# Patient Record
Sex: Female | Born: 1935 | Race: White | Hispanic: No | Marital: Single | Smoking: Never smoker
Health system: Southern US, Community
[De-identification: ages and names within clinical notes are randomized; demographics above are authoritative.]

## PROBLEM LIST (undated history)

## (undated) DIAGNOSIS — I10 Essential (primary) hypertension: Secondary | ICD-10-CM

## (undated) DIAGNOSIS — R0602 Shortness of breath: Secondary | ICD-10-CM

---

## 1974-05-22 HISTORY — PX: ABDOMINAL SURGERY: SHX537

## 2001-07-02 ENCOUNTER — Emergency Department (HOSPITAL_COMMUNITY): Admission: EM | Admit: 2001-07-02 | Discharge: 2001-07-02 | Payer: Self-pay | Admitting: Emergency Medicine

## 2004-05-20 ENCOUNTER — Emergency Department: Payer: Self-pay | Admitting: Emergency Medicine

## 2008-09-03 ENCOUNTER — Emergency Department: Payer: Self-pay | Admitting: Emergency Medicine

## 2010-03-30 IMAGING — CT CT HEAD WITHOUT CONTRAST
2 series · 15 of 30 positions shown, 19 images · non-contrast
Comparison: none

REASON FOR EXAM: fall/ pain
COMMENTS:   LMP: Post-Menopausal

PROCEDURE:     CT  - CT HEAD WITHOUT CONTRAST  - September 03, 2008  [DATE]
RESULT:     Comparison: No comparison
TECHNIQUE: Multiple axial images from the foramen magnum to the vertex were
obtained without IV contrast.

[Series 2: without · axial · non-contrast · 0.41mm/px · z∈[+444,+579]mm · 13 of 33 slices shown, 17 images]
[im 3/33  brain]
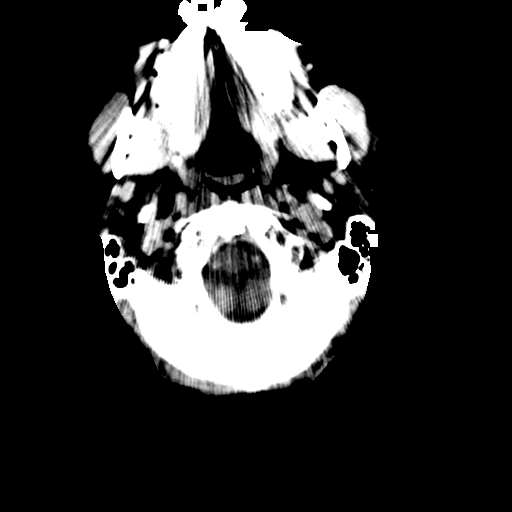
[im 3/33  bone]
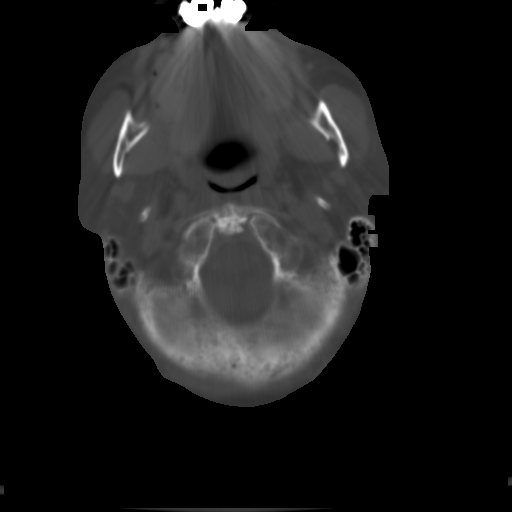
[im 5/33  brain]
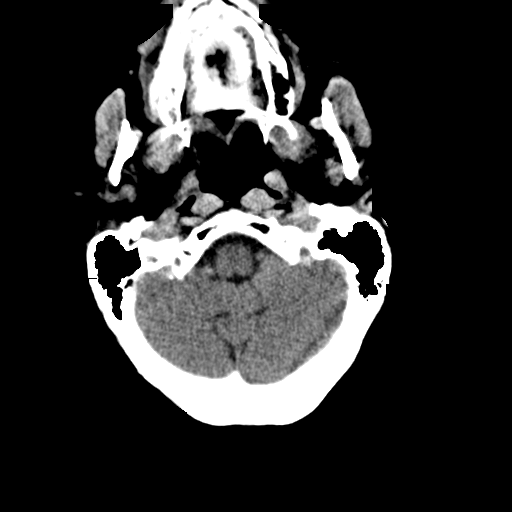
[im 7/33  brain]
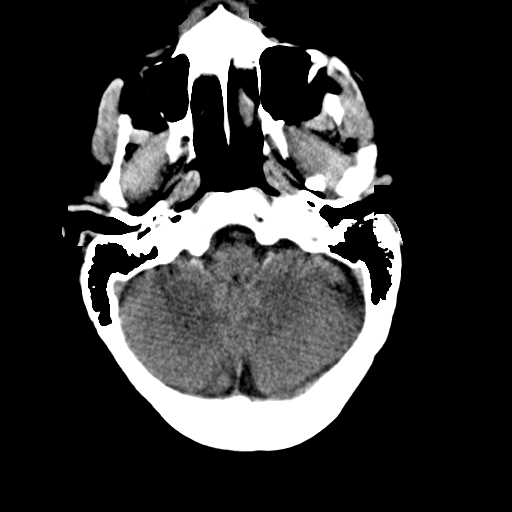
[im 10/33  brain]
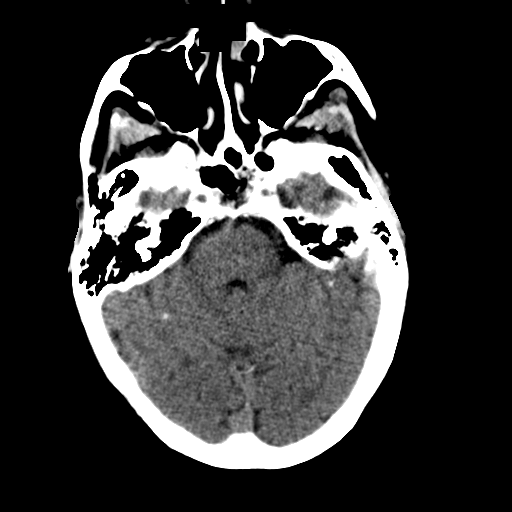
[im 12/33  brain]
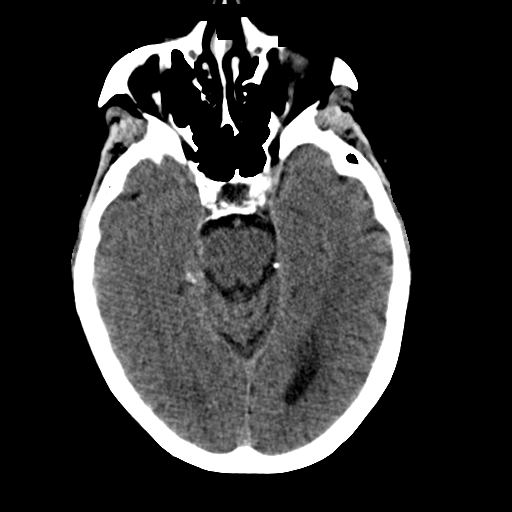
[im 12/33  bone]
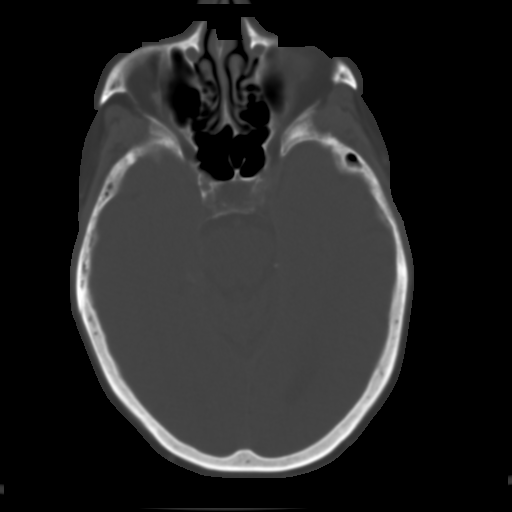
[im 14/33  brain]
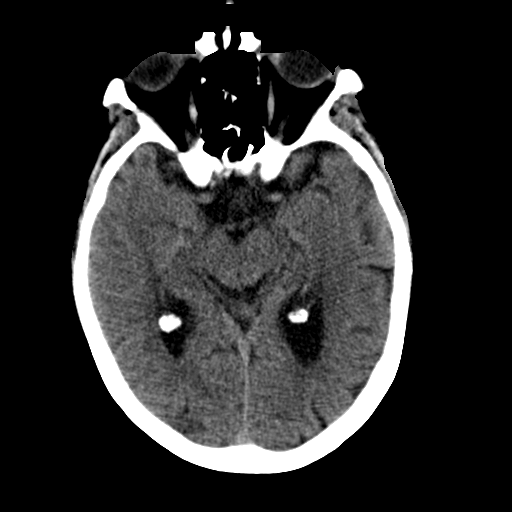
[im 17/33  brain]
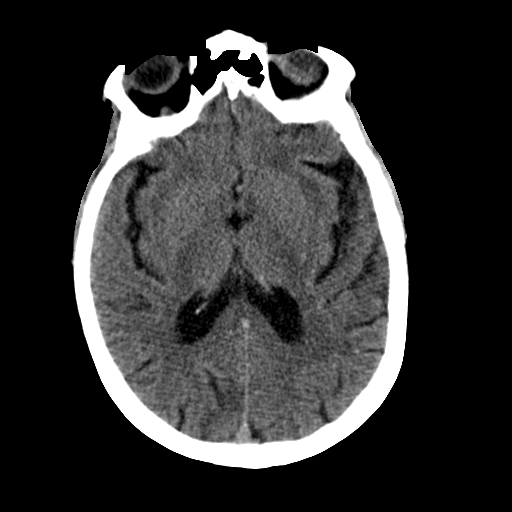
[im 19/33  brain]
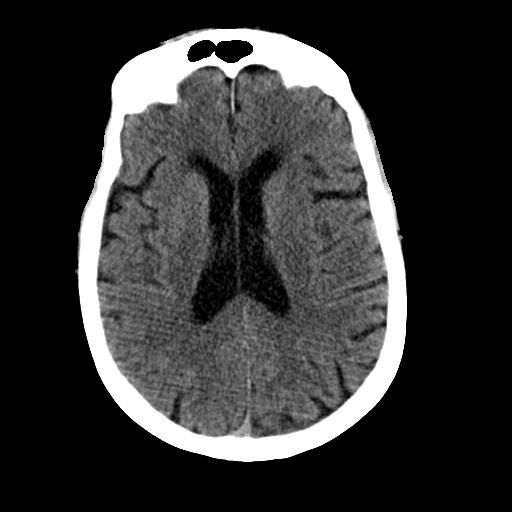
[im 21/33  brain]
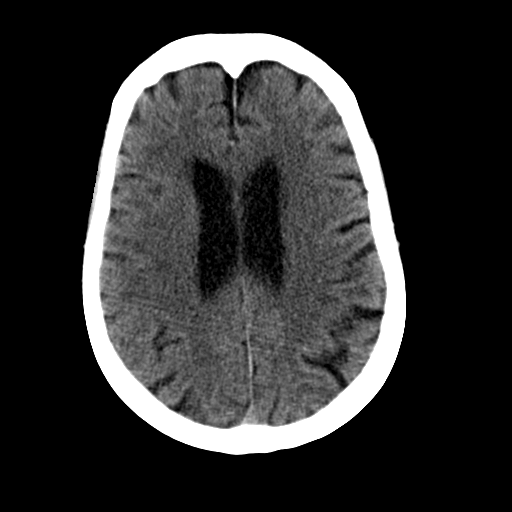
[im 21/33  bone]
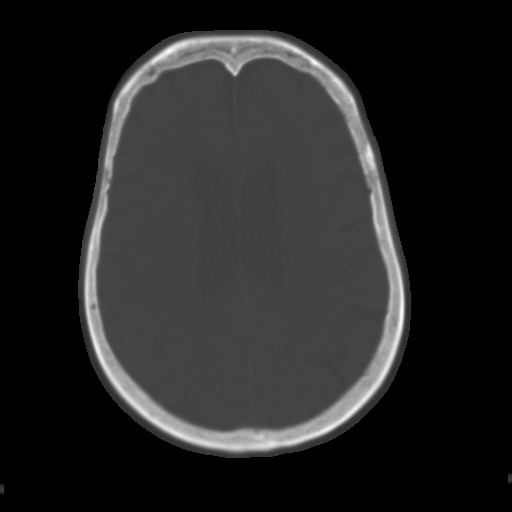
[im 23/33  brain]
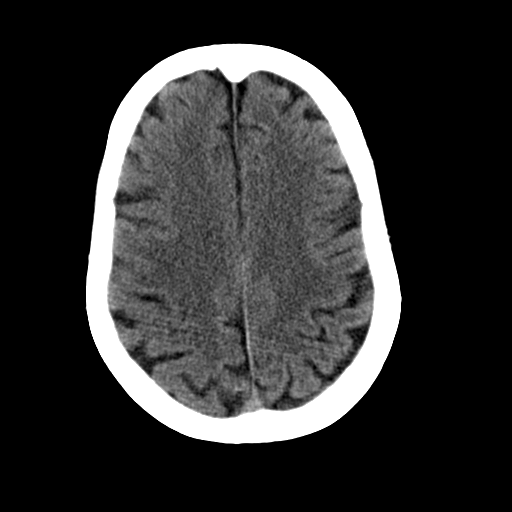
[im 26/33  brain]
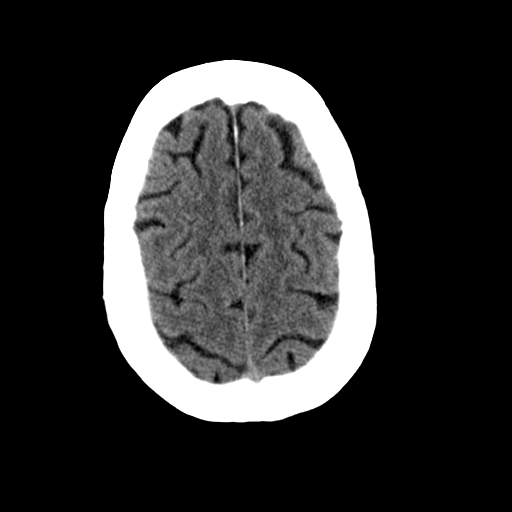
[im 28/33  brain]
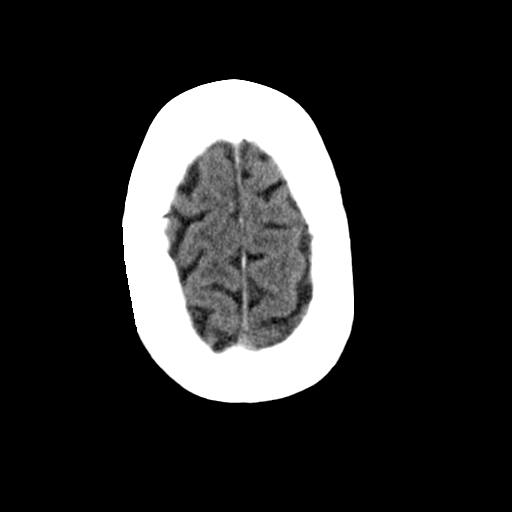
[im 30/33  brain]
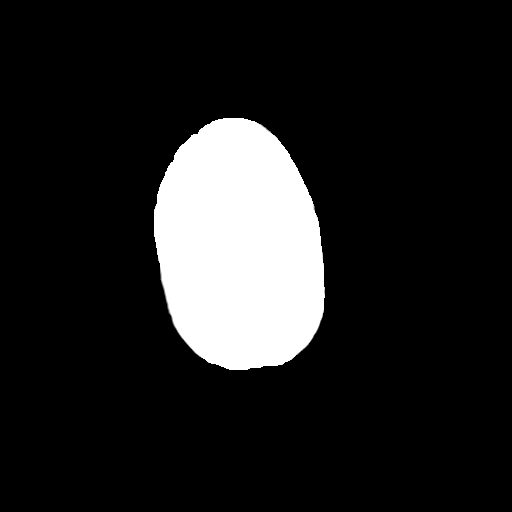
[im 30/33  bone]
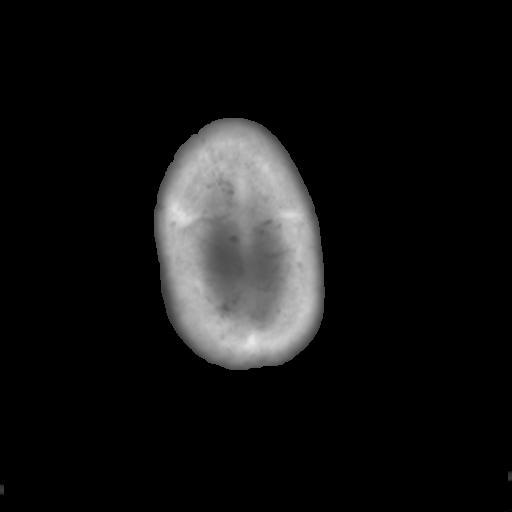

[Series 3: bone · axial · 0.41mm/px · z∈[+444,+464]mm · 2 of 33 slices shown]
[im 3/33  bone]
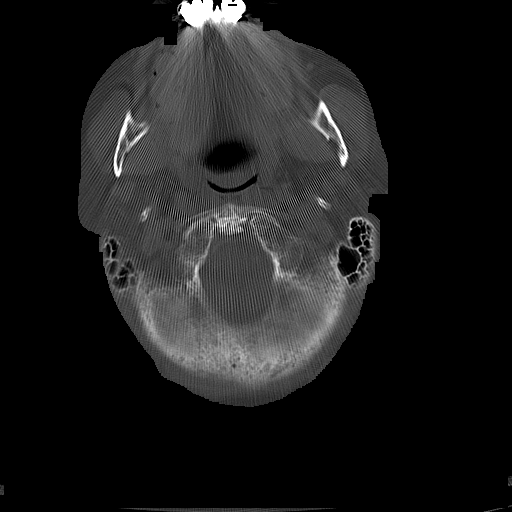
[im 7/33  bone]
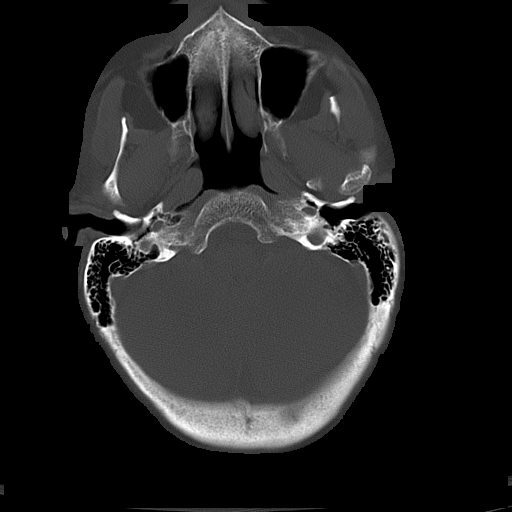

[15 of 30 positions shown; findings below may reference images not displayed]

FINDINGS: There is no evidence for mass effect, midline shift, or extra-axial fluid
collections.  There is no evidence for space-occupying lesion or
intracranial hemorrhage. There is no evidence for a cortical-based area of
acute infarction.

There is generalized cerebral atrophy. There is periventricular white matter
low attenuation likely secondary to microangiopathy.

The ventricles and sulci are appropriate for the patient's age. The basal
cisterns are patent.

Visualized portions of the orbits are unremarkable. The paranasal sinuses
and mastoid air cells are unremarkable.

The osseous structures are unremarkable.
IMPRESSION: No acute intracranial process.

## 2010-03-30 IMAGING — CR DG CHEST 1V
1 series · 1 of 1 positions shown · non-contrast
Comparison: none

REASON FOR EXAM: fall/pain
COMMENTS:   LMP: Post-Menopausal

[view not recorded]
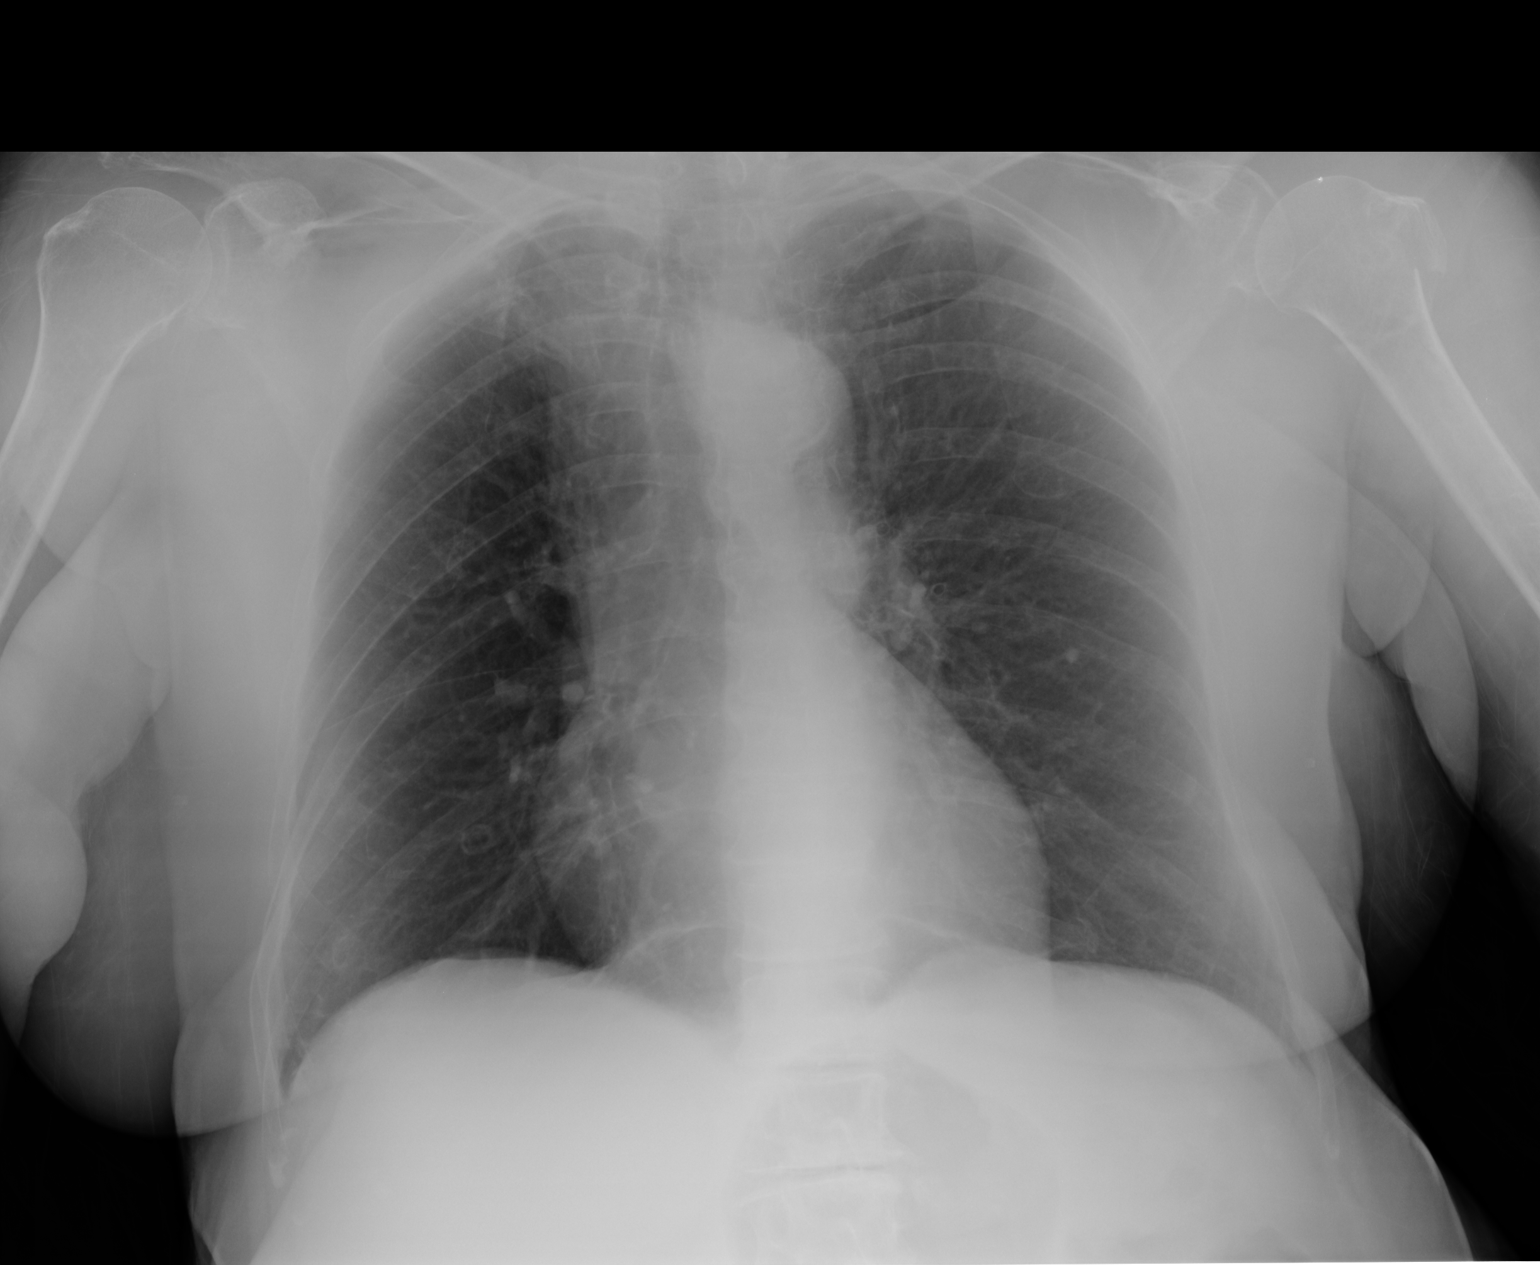

[1 of 1 positions shown; findings below may reference images not displayed]

PROCEDURE:     DXR - DXR CHEST 1 VIEWAP OR PA  - September 03, 2008  [DATE]

RESULT:     The lungs are adequately inflated. There is no focal infiltrate.
The cardiac silhouette is normal in size. The pulmonary vascularity is not
engorged. The patient has sustained a fracture of the humeral neck and
greater tuberosity on the left.
IMPRESSION: I do not see evidence of acute cardiopulmonary abnormality.
There is likely an element of underlying COPD.

## 2010-07-15 ENCOUNTER — Ambulatory Visit
Admission: RE | Admit: 2010-07-15 | Discharge: 2010-07-15 | Disposition: A | Discharge status: Home / Self Care | Payer: Medicare Other | Source: Ambulatory Visit | Attending: Endocrinology | Admitting: Endocrinology

## 2010-07-15 ENCOUNTER — Other Ambulatory Visit: Payer: Self-pay | Admitting: Endocrinology

## 2010-07-15 DIAGNOSIS — E049 Nontoxic goiter, unspecified: Secondary | ICD-10-CM

## 2011-04-28 ENCOUNTER — Other Ambulatory Visit: Payer: Self-pay | Admitting: Endocrinology

## 2011-04-28 DIAGNOSIS — E049 Nontoxic goiter, unspecified: Secondary | ICD-10-CM

## 2011-05-22 ENCOUNTER — Ambulatory Visit
Admission: RE | Admit: 2011-05-22 | Discharge: 2011-05-22 | Disposition: A | Discharge status: Home / Self Care | Payer: Medicare Other | Source: Ambulatory Visit | Attending: Endocrinology | Admitting: Endocrinology

## 2011-05-22 DIAGNOSIS — E049 Nontoxic goiter, unspecified: Secondary | ICD-10-CM

## 2013-06-16 ENCOUNTER — Emergency Department: Payer: Self-pay | Admitting: Emergency Medicine

## 2013-06-16 ENCOUNTER — Inpatient Hospital Stay (HOSPITAL_COMMUNITY)
Admission: AD | Admit: 2013-06-16 | Discharge: 2013-06-21 | DRG: 374 | Disposition: A | Discharge status: Home / Self Care | Payer: Medicare Other | Source: Other Acute Inpatient Hospital | Attending: Family Medicine | Admitting: Family Medicine

## 2013-06-16 ENCOUNTER — Encounter (HOSPITAL_COMMUNITY): Payer: Self-pay | Admitting: *Deleted

## 2013-06-16 DIAGNOSIS — F0391 Unspecified dementia with behavioral disturbance: Secondary | ICD-10-CM | POA: Diagnosis present

## 2013-06-16 DIAGNOSIS — R933 Abnormal findings on diagnostic imaging of other parts of digestive tract: Secondary | ICD-10-CM

## 2013-06-16 DIAGNOSIS — Z638 Other specified problems related to primary support group: Secondary | ICD-10-CM

## 2013-06-16 DIAGNOSIS — Z66 Do not resuscitate: Secondary | ICD-10-CM | POA: Diagnosis present

## 2013-06-16 DIAGNOSIS — D649 Anemia, unspecified: Secondary | ICD-10-CM | POA: Diagnosis present

## 2013-06-16 DIAGNOSIS — I214 Non-ST elevation (NSTEMI) myocardial infarction: Secondary | ICD-10-CM | POA: Diagnosis present

## 2013-06-16 DIAGNOSIS — E039 Hypothyroidism, unspecified: Secondary | ICD-10-CM | POA: Diagnosis present

## 2013-06-16 DIAGNOSIS — I10 Essential (primary) hypertension: Secondary | ICD-10-CM | POA: Diagnosis present

## 2013-06-16 DIAGNOSIS — C787 Secondary malignant neoplasm of liver and intrahepatic bile duct: Secondary | ICD-10-CM | POA: Diagnosis present

## 2013-06-16 DIAGNOSIS — R7989 Other specified abnormal findings of blood chemistry: Secondary | ICD-10-CM | POA: Diagnosis present

## 2013-06-16 DIAGNOSIS — F03918 Unspecified dementia, unspecified severity, with other behavioral disturbance: Secondary | ICD-10-CM | POA: Diagnosis present

## 2013-06-16 DIAGNOSIS — R634 Abnormal weight loss: Secondary | ICD-10-CM | POA: Diagnosis present

## 2013-06-16 DIAGNOSIS — R1900 Intra-abdominal and pelvic swelling, mass and lump, unspecified site: Secondary | ICD-10-CM

## 2013-06-16 DIAGNOSIS — C182 Malignant neoplasm of ascending colon: Principal | ICD-10-CM | POA: Diagnosis present

## 2013-06-16 DIAGNOSIS — C50919 Malignant neoplasm of unspecified site of unspecified female breast: Secondary | ICD-10-CM | POA: Diagnosis present

## 2013-06-16 DIAGNOSIS — C189 Malignant neoplasm of colon, unspecified: Secondary | ICD-10-CM

## 2013-06-16 DIAGNOSIS — R778 Other specified abnormalities of plasma proteins: Secondary | ICD-10-CM | POA: Diagnosis present

## 2013-06-16 DIAGNOSIS — K922 Gastrointestinal hemorrhage, unspecified: Secondary | ICD-10-CM | POA: Diagnosis present

## 2013-06-16 DIAGNOSIS — D509 Iron deficiency anemia, unspecified: Secondary | ICD-10-CM | POA: Diagnosis present

## 2013-06-16 DIAGNOSIS — Z9119 Patient's noncompliance with other medical treatment and regimen: Secondary | ICD-10-CM

## 2013-06-16 DIAGNOSIS — Z515 Encounter for palliative care: Secondary | ICD-10-CM

## 2013-06-16 DIAGNOSIS — R4182 Altered mental status, unspecified: Secondary | ICD-10-CM | POA: Diagnosis present

## 2013-06-16 DIAGNOSIS — Z91199 Patient's noncompliance with other medical treatment and regimen due to unspecified reason: Secondary | ICD-10-CM

## 2013-06-16 DIAGNOSIS — R531 Weakness: Secondary | ICD-10-CM

## 2013-06-16 DIAGNOSIS — C779 Secondary and unspecified malignant neoplasm of lymph node, unspecified: Secondary | ICD-10-CM

## 2013-06-16 DIAGNOSIS — I6529 Occlusion and stenosis of unspecified carotid artery: Secondary | ICD-10-CM | POA: Diagnosis present

## 2013-06-16 DIAGNOSIS — K921 Melena: Secondary | ICD-10-CM

## 2013-06-16 DIAGNOSIS — R0602 Shortness of breath: Secondary | ICD-10-CM | POA: Diagnosis present

## 2013-06-16 HISTORY — DX: Essential (primary) hypertension: I10

## 2013-06-16 HISTORY — DX: Shortness of breath: R06.02

## 2013-06-16 LAB — COMPREHENSIVE METABOLIC PANEL
ALT: 15 U/L (ref 12–78)
Albumin: 2.6 g/dL — ABNORMAL LOW (ref 3.4–5.0)
Alkaline Phosphatase: 111 U/L
Anion Gap: 7 (ref 7–16)
BUN: 14 mg/dL (ref 7–18)
Bilirubin,Total: 0.4 mg/dL (ref 0.2–1.0)
CO2: 25 mmol/L (ref 21–32)
Calcium, Total: 8.3 mg/dL — ABNORMAL LOW (ref 8.5–10.1)
Chloride: 106 mmol/L (ref 98–107)
Creatinine: 0.93 mg/dL (ref 0.60–1.30)
EGFR (Non-African Amer.): 55 — ABNORMAL LOW
GLUCOSE: 91 mg/dL (ref 65–99)
OSMOLALITY: 276 (ref 275–301)
Potassium: 4.2 mmol/L (ref 3.5–5.1)
SGOT(AST): 59 U/L — ABNORMAL HIGH (ref 15–37)
SODIUM: 138 mmol/L (ref 136–145)
Total Protein: 5.7 g/dL — ABNORMAL LOW (ref 6.4–8.2)

## 2013-06-16 LAB — CBC
HCT: 25.2 % — ABNORMAL LOW (ref 35.0–47.0)
HGB: 7.5 g/dL — AB (ref 12.0–16.0)
MCH: 20.8 pg — AB (ref 26.0–34.0)
MCHC: 29.6 g/dL — ABNORMAL LOW (ref 32.0–36.0)
MCV: 70 fL — ABNORMAL LOW (ref 80–100)
Platelet: 372 10*3/uL (ref 150–440)
RBC: 3.6 10*6/uL — AB (ref 3.80–5.20)
RDW: 23.9 % — AB (ref 11.5–14.5)
WBC: 4.5 10*3/uL (ref 3.6–11.0)

## 2013-06-16 LAB — URINALYSIS, COMPLETE
Bacteria: NEGATIVE
Bilirubin,UR: NEGATIVE
Blood: NEGATIVE
GLUCOSE, UR: NEGATIVE mg/dL (ref 0–75)
Leukocyte Esterase: NEGATIVE
NITRITE: NEGATIVE
PH: 6 (ref 4.5–8.0)
Protein: 30
RBC, UR: NONE SEEN /HPF (ref 0–5)
Specific Gravity: 1.015 (ref 1.003–1.030)
WBC UR: NONE SEEN /HPF (ref 0–5)

## 2013-06-16 LAB — CK TOTAL AND CKMB (NOT AT ARMC)
CK, TOTAL: 209 U/L (ref 21–215)
CK-MB: 12.5 ng/mL — ABNORMAL HIGH (ref 0.5–3.6)

## 2013-06-16 LAB — CK-MB
CK-MB: 19.7 ng/mL — ABNORMAL HIGH (ref 0.5–3.6)
CK-MB: 28.3 ng/mL — ABNORMAL HIGH (ref 0.5–3.6)

## 2013-06-16 LAB — T4, FREE: FREE THYROXINE: 0.32 ng/dL — AB (ref 0.76–1.46)

## 2013-06-16 LAB — TSH: THYROID STIMULATING HORM: 47.3 u[IU]/mL — AB

## 2013-06-16 LAB — PROTIME-INR
INR: 1
Prothrombin Time: 12.9 secs (ref 11.5–14.7)

## 2013-06-16 LAB — TROPONIN I: Troponin-I: 3.31 ng/mL — ABNORMAL HIGH

## 2013-06-16 NOTE — Progress Notes (Signed)
RN paged [employer]er, [employer]. Awaiting for admission orders.

## 2013-06-16 NOTE — H&P (Signed)
PCP:  [facility]   Chief Complaint:  Chest pain  HPI: Wendy Wade is a 80 y.o. female   has a past medical history of Shortness of breath and Hypertension.   Presented with  Patient has been confused and has underlying dementia and unable to provide much of her own history. 2 sons at bedside. Per Family patient has been deteriorating over the past few weeks/months. Today they brought her in to emergency department at [facility] GU 2 complains of throat pain radiating to the jaws bilaterally it as well as some early chest pains. Patient have had dyspnea on exertion for quite some time. She hasn't followed up with primary care provider have been seeing Dr. Lorrene Reid she on numerous occasions. Patient has history of anemia with baseline hemoglobin around 7.8 few months ago. Today at [facility] hemoglobin was noted to be 7.5 with elevated troponin of 3 and a TSH of 47. The patient refused to father evaluation at [facility] and was transferred to [facility]. I spoke to Dr. Nadyne Coombes who at this point recommends aspirin 81 mg a day and GI workup for possible chronic GI blood loss. Family states that she has not had any blood per rectum,  but has had black stools for past 2 weeks. Family also noted that her right leg has been somewhat small swollen for the past few weeks as well patient states she has history of injury to that leg. Family noted that she has a mass around her umbilicus it has been there for past 4-5 months. Patient has had extensive weight loss. Decreased by mouth intake and poor appetite. She hadn't had regular mammograms or colonoscopies per family. 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  Of note patient's blood pressure noted to be low 90s/80s her family this is her baseline.    Review of Systems:    Pertinent positives include: throat pain, chest pain, shortness of breath at  rest.  Constitutional:  No weight loss, night sweats, Fevers, chills, fatigue, weight loss  HEENT:  No headaches, Difficulty swallowing,Tooth/dental problems,Sore throat,  No sneezing, itching, ear ache, nasal congestion, post nasal drip,  Cardio-vascular:  Orthopnea, PND, anasarca, dizziness, palpitations.no Bilateral lower extremity swelling  GI:  No heartburn, indigestion, abdominal pain, nausea, vomiting, diarrhea, change in bowel habits, loss of appetite, melena, blood in stool, hematemesis Resp:  no No dyspnea on exertion, No excess mucus, no productive cough, No non-productive cough, No coughing up of blood.No change in color of mucus.No wheezing. Skin:  no rash or lesions. No jaundice GU:  no dysuria, change in color of urine, no urgency or frequency. No straining to urinate.  No flank pain.  Musculoskeletal:  No joint pain or no joint swelling. No decreased range of motion. No back pain.  Psych:  No change in mood or affect. No depression or anxiety. No memory loss.  Neuro: no localizing neurological complaints, no tingling, no weakness, no double vision, no gait abnormality, no slurred speech, no confusion  Otherwise ROS are negative except for above, 10 systems were reviewed  Past Medical History: Past Medical History  Diagnosis Date  . Shortness of breath   . Hypertension    Past Surgical History  Procedure Laterality Date  . Abdominal surgery  1976    tumor removed     Medications: Prior to Admission medications   Medication Sig Start Date End Date Taking? Authorizing Provider  atorvastatin (LIPITOR)  10 MG tablet Take by mouth daily.   Yes Historical Provider, MD    Allergies:  No Known Allergies  Social History:  Ambulatory  independently with assistance Lives at home with son   reports that she has never smoked. She does not have any smokeless tobacco history on file. She reports that she does not drink alcohol or use illicit drugs.   Family  History: family history is not on file.    Physical Exam: Patient Vitals for the past 24 hrs:  BP Temp Temp src Pulse Resp SpO2 Height Weight  06/16/13 2109 98/66 mmHg 97.9 F (36.6 C) Oral 76 16 99 % 5' (1.524 m) 42.1 kg (92 lb 13 oz)    1. General:  in No Acute distress 2. Psychological: Alert and  Oriented 3. Head/ENT:   Moist  Mucous Membranes                          Head Non traumatic, neck supple                          Normal   Dentition 4. SKIN: normal   Skin turgor,  Skin clean Dry and intact no rash 5. Heart: Regular rate and rhythm no Murmur, Rub or gallop 6. Lungs: Clear to auscultation bilaterally, no wheezes or crackles   7. Abdomen: Soft, non-tender, Non distended, ventral and left upper quadrant scars present well healed. There is a. Umbilical mass measuring about 2 cm noted, which is firm and seems to be adherent to the underlying tissues. Numerous other smaller palpable nodularity noted the intra-abdominal wall. 8. Lower extremities: no clubbing, cyanosis, right leg edema 1+ 9. Neurologically Grossly intact, moving all 4 extremities equally 10. MSK: Normal range of motion  body mass index is 18.13 kg/(m^2).   Labs on Admission:  No results found for this basename: NA, K, CL, CO2, GLUCOSE, BUN, CREATININE, CALCIUM, MG, PHOS,  in the last 72 hours No results found for this basename: AST, ALT, ALKPHOS, BILITOT, PROT, ALBUMIN,  in the last 72 hours No results found for this basename: LIPASE, AMYLASE,  in the last 72 hours No results found for this basename: WBC, NEUTROABS, HGB, HCT, MCV, PLT,  in the last 72 hours No results found for this basename: CKTOTAL, CKMB, CKMBINDEX, TROPONINI,  in the last 72 hours No results found for this basename: TSH, T4TOTAL, FREET3, T3FREE, THYROIDAB,  in the last 72 hours No results found for this basename: VITAMINB12, FOLATE, FERRITIN, TIBC, IRON, RETICCTPCT,  in the last 72 hours No results found for this basename: HGBA1C     CrCl is unknown because no creatinine reading has been taken. ABG No results found for this basename: phart, pco2, po2, hco3, tco2, acidbasedef, o2sat     No results found for this basename: DDIMER     Other results: TSH 47.3 Troponin 3.31 hemoglobin 7.5 platelets 372 MCV 70 \\BUN  14 creatinine 0.9 free thyroxine 0.32 Potassium 4.2 total bilirubin 0.4 alkaline phosphatase lipase 111 ALT 16 AST 59 total protein 5.7 albumin 2.6  I have pearsonaly reviewed this: ECG REPORT  Rate: 74  Rhythm: Sinus rhythm low voltage  ST&T Change: T wave inversions in lead 2 and 3 noted  UA per [facility] no evidence of UTI   Cultures: No results found for this basename: sdes, specrequest, cult, reptstatus       Radiological Exams on Admission: CT scan of  the head showing mild likely age-appropriate atrophy and microvascular ischemic diseases had acute intracranial process Chest x-ray no evidence of acute cardiopulmonary disease  Chart has been reviewed  Assessment/Plan  This is 80 year old female with history of dementia medical noncompliance hypertension presenting with worsening of her chronic anemia, chest discomfort and shortness of breath of elevated troponin of 3.3  Present on Admission:  . NSTEMI (non-ST elevated myocardial infarction) - spoke to Dr. Nadyne Coombes 2 at this point recommends aspirin 81 mg a day we'll not heparinize given likely GI bleed. Patient is currently chest pain-free is no evidence of ST elevation MI on EKG. Dr. Nadyne Coombes at this point  Does not think she is a good candidate for cardiac catheterization . Altered mental status - in the setting of anemia and chronic dementia  . Dementia with behavioral disturbance - patient has ongoing dementia which is her baseline sometimes she is noncooperative with care  . Anemia - possibly due to upper GI bleed with given history of melena for the past 2 weeks. Anemia has contact given prior to that is a small chronic problem. GYN  obtain an anemia panel. Transfuse 2 units given symptomatic anemia. The watch and step down, Protonix IV twice a day, and few more for possible GI evaluation. Please obtain GI consult in a.m. or sooner if the patient decompensates  . Elevated troponin - will continue to cycle cardiac enzymes.   right leg swelling this been ongoing for past 2 weeks. Patient's sedentary lifestyle evaluate for DVT if positive would need to evaluate for PE given some shortness of breath which at this point most likely secondary to severe anemia Abdominal mass - will obtain CT scan of abdomen to further evaluate this Hypothyroidism - patient has not been taking any of her medications will need to restart her thyroid meds unsure what her dosages supposed to be.  Currently her family is unaware of a what her medications are. Poor swallowing - per family patient hasn't been able to swallow her pills for years and needsthem crushed. Will have swallow evaluation ordered Prophylaxis: SCD , Protonix  CODE STATUS:FULL CODE  Other plan as per orders.  I have spent a total of 65 min on this admission extra time is taking due to complexity of medical admission have spoken with pharmacy as well as cardiology regarding the patient  [place] 06/16/2013, 11:54 PM

## 2013-06-16 NOTE — Progress Notes (Signed)
Pt arrived on floor per EMS. Report given just when pt arrived to floor by Serita Grit, [facility]. Pt alert oriented. Will continue to monitor.

## 2013-06-17 ENCOUNTER — Inpatient Hospital Stay (HOSPITAL_COMMUNITY): Payer: Medicare Other

## 2013-06-17 ENCOUNTER — Encounter (HOSPITAL_COMMUNITY): Payer: Self-pay | Admitting: Radiology

## 2013-06-17 DIAGNOSIS — R609 Edema, unspecified: Secondary | ICD-10-CM

## 2013-06-17 DIAGNOSIS — I214 Non-ST elevation (NSTEMI) myocardial infarction: Secondary | ICD-10-CM | POA: Diagnosis present

## 2013-06-17 DIAGNOSIS — R1900 Intra-abdominal and pelvic swelling, mass and lump, unspecified site: Secondary | ICD-10-CM | POA: Diagnosis present

## 2013-06-17 DIAGNOSIS — D649 Anemia, unspecified: Secondary | ICD-10-CM

## 2013-06-17 DIAGNOSIS — C787 Secondary malignant neoplasm of liver and intrahepatic bile duct: Secondary | ICD-10-CM

## 2013-06-17 DIAGNOSIS — I059 Rheumatic mitral valve disease, unspecified: Secondary | ICD-10-CM

## 2013-06-17 DIAGNOSIS — C189 Malignant neoplasm of colon, unspecified: Secondary | ICD-10-CM

## 2013-06-17 DIAGNOSIS — R933 Abnormal findings on diagnostic imaging of other parts of digestive tract: Secondary | ICD-10-CM

## 2013-06-17 DIAGNOSIS — K921 Melena: Secondary | ICD-10-CM

## 2013-06-17 LAB — RETICULOCYTES
RBC.: 3.8 MIL/uL — ABNORMAL LOW (ref 3.87–5.11)
RETIC COUNT ABSOLUTE: 57 10*3/uL (ref 19.0–186.0)
RETIC CT PCT: 1.5 % (ref 0.4–3.1)

## 2013-06-17 LAB — CBC
HCT: 38.7 % (ref 36.0–46.0)
HEMATOCRIT: 38.4 % (ref 36.0–46.0)
HEMOGLOBIN: 12.5 g/dL (ref 12.0–15.0)
HEMOGLOBIN: 12.3 g/dL (ref 12.0–15.0)
MCH: 25.2 pg — AB (ref 26.0–34.0)
MCH: 24.8 pg — AB (ref 26.0–34.0)
MCHC: 32.3 g/dL (ref 30.0–36.0)
MCHC: 32 g/dL (ref 30.0–36.0)
MCV: 77.9 fL — AB (ref 78.0–100.0)
MCV: 77.4 fL — ABNORMAL LOW (ref 78.0–100.0)
Platelets: 297 10*3/uL (ref 150–400)
Platelets: 342 10*3/uL (ref 150–400)
RBC: 4.97 MIL/uL (ref 3.87–5.11)
RBC: 4.96 MIL/uL (ref 3.87–5.11)
RDW: 20.3 % — ABNORMAL HIGH (ref 11.5–15.5)
RDW: 20.4 % — ABNORMAL HIGH (ref 11.5–15.5)
WBC: 5.7 10*3/uL (ref 4.0–10.5)
WBC: 6.3 10*3/uL (ref 4.0–10.5)

## 2013-06-17 LAB — COMPREHENSIVE METABOLIC PANEL
ALBUMIN: 2.6 g/dL — AB (ref 3.5–5.2)
ALT: 12 U/L (ref 0–35)
AST: 53 U/L — AB (ref 0–37)
Alkaline Phosphatase: 108 U/L (ref 39–117)
BUN: 12 mg/dL (ref 6–23)
CO2: 22 mEq/L (ref 19–32)
CREATININE: 0.83 mg/dL (ref 0.50–1.10)
Calcium: 8.1 mg/dL — ABNORMAL LOW (ref 8.4–10.5)
Chloride: 106 mEq/L (ref 96–112)
GFR calc Af Amer: 71 mL/min — ABNORMAL LOW (ref >90)
GFR calc non Af Amer: 62 mL/min — ABNORMAL LOW (ref >90)
Glucose, Bld: 72 mg/dL (ref 70–99)
Potassium: 4.3 mEq/L (ref 3.7–5.3)
Sodium: 142 mEq/L (ref 137–147)
Total Bilirubin: 0.4 mg/dL (ref 0.3–1.2)
Total Protein: 5.5 g/dL — ABNORMAL LOW (ref 6.0–8.3)

## 2013-06-17 LAB — LIPID PANEL
CHOLESTEROL: 147 mg/dL (ref 0–200)
HDL: 46 mg/dL (ref >39)
LDL Cholesterol: 83 mg/dL (ref 0–99)
TRIGLYCERIDES: 89 mg/dL (ref <150)
Total CHOL/HDL Ratio: 3.2 RATIO
VLDL: 18 mg/dL (ref 0–40)

## 2013-06-17 LAB — PROTIME-INR
INR: 1.09 (ref 0.00–1.49)
INR: 1.02 (ref 0.00–1.49)
Prothrombin Time: 13.9 seconds (ref 11.6–15.2)
Prothrombin Time: 13.2 seconds (ref 11.6–15.2)

## 2013-06-17 LAB — CBC WITH DIFFERENTIAL/PLATELET
Basophils Absolute: 0.1 10*3/uL (ref 0.0–0.1)
Basophils Relative: 1 % (ref 0–1)
EOS ABS: 0.1 10*3/uL (ref 0.0–0.7)
Eosinophils Relative: 1 % (ref 0–5)
HEMATOCRIT: 27.6 % — AB (ref 36.0–46.0)
Hemoglobin: 7.7 g/dL — ABNORMAL LOW (ref 12.0–15.0)
LYMPHS ABS: 1.4 10*3/uL (ref 0.7–4.0)
Lymphocytes Relative: 24 % (ref 12–46)
MCH: 20.3 pg — ABNORMAL LOW (ref 26.0–34.0)
MCHC: 27.9 g/dL — AB (ref 30.0–36.0)
MCV: 72.6 fL — ABNORMAL LOW (ref 78.0–100.0)
Monocytes Absolute: 0.6 10*3/uL (ref 0.1–1.0)
Monocytes Relative: 11 % (ref 3–12)
NEUTROS ABS: 3.7 10*3/uL (ref 1.7–7.7)
Neutrophils Relative %: 63 % (ref 43–77)
PLATELETS: 451 10*3/uL — AB (ref 150–400)
RBC: 3.8 MIL/uL — ABNORMAL LOW (ref 3.87–5.11)
RDW: 22.9 % — ABNORMAL HIGH (ref 11.5–15.5)
WBC: 5.9 10*3/uL (ref 4.0–10.5)

## 2013-06-17 LAB — TROPONIN I
TROPONIN I: 6.42 ng/mL — AB (ref <0.30)
TROPONIN I: 2.39 ng/mL — AB (ref <0.30)
Troponin I: 2.85 ng/mL (ref <0.30)
Troponin I: 2.37 ng/mL (ref <0.30)

## 2013-06-17 LAB — BASIC METABOLIC PANEL
BUN: 12 mg/dL (ref 6–23)
CALCIUM: 8 mg/dL — AB (ref 8.4–10.5)
CO2: 20 mEq/L (ref 19–32)
Chloride: 104 mEq/L (ref 96–112)
Creatinine, Ser: 0.83 mg/dL (ref 0.50–1.10)
GFR calc Af Amer: 71 mL/min — ABNORMAL LOW (ref >90)
GFR calc non Af Amer: 62 mL/min — ABNORMAL LOW (ref >90)
Glucose, Bld: 61 mg/dL — ABNORMAL LOW (ref 70–99)
Potassium: 5 mEq/L (ref 3.7–5.3)
SODIUM: 137 meq/L (ref 137–147)

## 2013-06-17 LAB — D-DIMER, QUANTITATIVE (NOT AT ARMC): D-Dimer, Quant: 3.5 ug/mL-FEU — ABNORMAL HIGH (ref 0.00–0.48)

## 2013-06-17 LAB — MRSA PCR SCREENING: MRSA BY PCR: NEGATIVE

## 2013-06-17 LAB — ABO/RH: ABO/RH(D): A POS

## 2013-06-17 LAB — HEMOGLOBIN A1C
HEMOGLOBIN A1C: 5.6 % (ref <5.7)
Mean Plasma Glucose: 114 mg/dL (ref <117)

## 2013-06-17 LAB — T4, FREE: FREE T4: 0.24 ng/dL — AB (ref 0.80–1.80)

## 2013-06-17 LAB — OCCULT BLOOD X 1 CARD TO LAB, STOOL
FECAL OCCULT BLD: NEGATIVE
Fecal Occult Bld: NEGATIVE

## 2013-06-17 LAB — PREPARE RBC (CROSSMATCH)

## 2013-06-17 LAB — APTT: aPTT: 29 seconds (ref 24–37)

## 2013-06-17 LAB — CLOSTRIDIUM DIFFICILE BY PCR: Toxigenic C. Difficile by PCR: NEGATIVE

## 2013-06-17 LAB — T3: T3 TOTAL: 29.8 ng/dL — AB (ref 80.0–204.0)

## 2013-06-17 MED ORDER — ONDANSETRON HCL 4 MG/2ML IJ SOLN: 4.0000 mg | Freq: Four times a day (QID) | INTRAMUSCULAR | Status: DC | PRN

## 2013-06-17 MED ORDER — DIPHENHYDRAMINE HCL 25 MG PO CAPS
25.0000 mg | ORAL_CAPSULE | Freq: Once | ORAL | Status: AC
  Administered 2013-06-17: 25 mg via ORAL
  Filled 2013-06-17 (×2): qty 1

## 2013-06-17 MED ORDER — SODIUM CHLORIDE 0.9 % IV SOLN
INTRAVENOUS | Status: DC
  Administered 2013-06-17 – 2013-06-20 (×3): via INTRAVENOUS

## 2013-06-17 MED ORDER — SUCRALFATE 1 GM/10ML PO SUSP
1.0000 g | Freq: Three times a day (TID) | ORAL | Status: DC
  Administered 2013-06-17: 1 g via ORAL
  Filled 2013-06-17 (×5): qty 10

## 2013-06-17 MED ORDER — ACETAMINOPHEN 325 MG PO TABS
650.0000 mg | ORAL_TABLET | ORAL | Status: DC | PRN
  Filled 2013-06-17: qty 2

## 2013-06-17 MED ORDER — PANTOPRAZOLE SODIUM 40 MG IV SOLR
40.0000 mg | Freq: Two times a day (BID) | INTRAVENOUS | Status: DC
  Administered 2013-06-17 (×2): 40 mg via INTRAVENOUS
  Filled 2013-06-17 (×3): qty 40

## 2013-06-17 MED ORDER — PANTOPRAZOLE SODIUM 40 MG PO TBEC
40.0000 mg | DELAYED_RELEASE_TABLET | Freq: Every day | ORAL | Status: DC
  Filled 2013-06-17: qty 1

## 2013-06-17 MED ORDER — METOPROLOL SUCCINATE ER 25 MG PO TB24
25.0000 mg | ORAL_TABLET | Freq: Every day | ORAL | Status: DC
  Administered 2013-06-17: 25 mg via ORAL
  Filled 2013-06-17 (×2): qty 1

## 2013-06-17 MED ORDER — ATORVASTATIN CALCIUM 10 MG PO TABS
10.0000 mg | ORAL_TABLET | Freq: Every day | ORAL | Status: DC
  Administered 2013-06-17: 10 mg via ORAL
  Filled 2013-06-17 (×3): qty 1

## 2013-06-17 MED ORDER — SODIUM CHLORIDE 0.9 % IV SOLN
500.0000 mL | Freq: Once | INTRAVENOUS | Status: AC
  Administered 2013-06-17: 01:00:00 via INTRAVENOUS

## 2013-06-17 MED ORDER — IOHEXOL 300 MG/ML  SOLN
100.0000 mL | Freq: Once | INTRAMUSCULAR | Status: AC | PRN
  Administered 2013-06-17: 100 mL via INTRAVENOUS

## 2013-06-17 MED ORDER — ACETAMINOPHEN 325 MG PO TABS
650.0000 mg | ORAL_TABLET | Freq: Once | ORAL | Status: AC
  Administered 2013-06-17: 650 mg via ORAL
  Filled 2013-06-17 (×2): qty 2

## 2013-06-17 MED ORDER — ASPIRIN 81 MG PO CHEW
81.0000 mg | CHEWABLE_TABLET | Freq: Every day | ORAL | Status: DC
  Administered 2013-06-17: 81 mg via ORAL
  Filled 2013-06-17 (×2): qty 1

## 2013-06-17 MED ORDER — SODIUM CHLORIDE 0.9 % IV BOLUS (SEPSIS)
500.0000 mL | Freq: Once | INTRAVENOUS | Status: AC
  Administered 2013-06-17: 02:00:00 via INTRAVENOUS

## 2013-06-17 MED ORDER — LEVOTHYROXINE SODIUM 50 MCG PO TABS
50.0000 ug | ORAL_TABLET | Freq: Every day | ORAL | Status: DC
  Administered 2013-06-17 – 2013-06-18 (×2): 50 ug via ORAL
  Filled 2013-06-17 (×4): qty 1

## 2013-06-17 MED ORDER — NITROGLYCERIN 0.4 MG SL SUBL: 0.4000 mg | SUBLINGUAL_TABLET | SUBLINGUAL | Status: DC | PRN

## 2013-06-17 MED ORDER — IOHEXOL 300 MG/ML  SOLN
25.0000 mL | INTRAMUSCULAR | Status: AC
  Administered 2013-06-17 (×2): 25 mL via ORAL

## 2013-06-17 NOTE — Progress Notes (Signed)
INITIAL NUTRITION ASSESSMENT  DOCUMENTATION CODES Per approved criteria  -Severe malnutrition in the context of chronic illness -Underweight   INTERVENTION: Recommend Ensure Complete po BID, each supplement provides 350 kcal and 13 grams of protein. Monitor magnesium, potassium, and phosphorus daily for at least 3 days, MD to replete as needed, as pt is at risk for refeeding syndrome given severe malnutrition. RD to continue to follow nutrition care plan.  NUTRITION DIAGNOSIS: Inadequate oral intake related to inability to eat as evidenced by NPO status.   Goal: Intake to meet >90% of estimated nutrition needs.  Monitor:  weight trends, lab trends, I/O's, PO intake, supplement tolerance  Reason for Assessment: Malnutrition Screening Tool  80 y.o. female  Admitting Dx: NSTEMI and GIB  ASSESSMENT: PMHx significant for dementia. Admitted with throat pain, chest pain, and dyspnea on exertion; pt also with black stools x 2 weeks. Work-up reveals NSTEMI and upper GIB.  Has been getting PRBC 2/2 low Hgb.  Family reports that pt has been deteriorating over the past few weeks/months. They report that she has had extensive weight loss associated with decreased appetite and oral intake. Family reports that pt is usually around 120 lb and weighed this 7 months ago. Per son, "pt doesn't eat anymore." He cannot really provide more information other than pt takes bites at meals. Pt has not tried Ensure, but likes milk products.  SLP completed BSE this morning, recommended Regular diet with thin liquids. Pt is unable to swallow whole pills; recommended continued crushing.  Nutrition Focused Physical Exam:  Subcutaneous Fat:  Orbital Region: moderate depletion Upper Arm Region: severe depletion Thoracic and Lumbar Region: n/a  Muscle:  Temple Region: severe depletion Clavicle Bone Region: severe depletion Clavicle and Acromion Bone Region: severe depletion Scapular Bone Region:  n/a Dorsal Hand: n/a Patellar Region: n/a Anterior Thigh Region: n/a Posterior Calf Region: n/a  Edema: n/a  Pt meets criteria for severe MALNUTRITION in the context of chronic illness as evidenced by severe fat and muscle mass loss, wt loss of 22% x 7 months, and intake of <75% x at least 1 month.   Height: Ht Readings from Last 1 Encounters:  06/17/13 5\' 2"  (1.575 m)    Weight: Wt Readings from Last 1 Encounters:  06/17/13 93 lb 0.6 oz (42.2 kg)    Ideal Body Weight: 110 lb  % Ideal Body Weight: 85%  Wt Readings from Last 10 Encounters:  06/17/13 93 lb 0.6 oz (42.2 kg)    Usual Body Weight: 120 lb  % Usual Body Weight: 78%  BMI:  Body mass index is 17.01 kg/(m^2). Underweight  Estimated Nutritional Needs: Kcal: 1400 - 1600 Protein: 50 - 60 g Fluid: at least 1.5 liters daily  Skin: intact  Diet Order: NPO  EDUCATION NEEDS: -No education needs identified at this time   Intake/Output Summary (Last 24 hours) at 06/17/13 1217 Last data filed at 06/17/13 0845  Gross per 24 hour  Intake 1581.25 ml  Output    100 ml  Net 1481.25 ml    Last BM: 1/27  Labs:   Recent Labs Lab 06/17/13 0101  NA 142  K 4.3  CL 106  CO2 22  BUN 12  CREATININE 0.83  CALCIUM 8.1*  GLUCOSE 72    CBG (last 3)  No results found for this basename: GLUCAP,  in the last 72 hours  Scheduled Meds: . aspirin  81 mg Oral Daily  . atorvastatin  10 mg Oral q1800  . levothyroxine  50 mcg Oral QAC breakfast  . pantoprazole (PROTONIX) IV  40 mg Intravenous Q12H  . sucralfate  1 g Oral TID WC & HS    Continuous Infusions: . sodium chloride Stopped (06/17/13 0547)    Past Medical History  Diagnosis Date  . Shortness of breath   . Hypertension     Past Surgical History  Procedure Laterality Date  . Abdominal surgery  1976    tumor removed    Inda Coke MS, RD, LDN Pager: [phone] After-hours pager: [phone]

## 2013-06-17 NOTE — Evaluation (Signed)
Clinical/Bedside Swallow Evaluation Patient Details  Name: Wendy Wade MRN: [id number] Date of Birth: [birth year]  Today's Date: 06/17/2013 Time: 1003-1021 SLP Time Calculation (min): 18 min  Past Medical History:  Past Medical History  Diagnosis Date  . Shortness of breath   . Hypertension    Past Surgical History:  Past Surgical History  Procedure Laterality Date  . Abdominal surgery  1976    tumor removed   HPI:  80 year old female with history of dementia, medical noncompliance, hypertension, presented with worsening of her chronic anemia, chest discomfort and shortness of breath.  Dx include NSTEMI, anemia - likely GI bleed, altered mental status - in the setting of anemia and chronic dementia.  Pt has had poor appetite with weight loss.  Per famly, hasn't been able to swallow her pills for years - requires crushing.      Assessment / Plan / Recommendation Clinical Impression  Bedside swallow eval initiated - limited POs provided (1 oz applesauce, half graham cracker, 4 sips water) before this SLP informed pt preparing for CT abdomen.  Ceased admin of POs; pt consumed contrast from straw.  Despite limited assessment, there were no indicators of a dysphagia - notable active mastication, swift swallow response, and no s/s aspiration.  Pt's family reports difficulty swallowing pills; discussed the need to continue crushing meds when able and encouraging PO intake.  No further SLP needs identified - will sign off.     Aspiration Risk  Mild    Diet Recommendation Regular;Thin liquid   Liquid Administration via: Cup;Straw Medication Administration: Crushed with puree    Other  Recommendations Oral Care Recommendations: Oral care BID   Follow Up Recommendations  None      Swallow Study Prior Functional Status       General  Type of Study: Bedside swallow evaluation Previous Swallow Assessment: none per records Diet Prior to this Study: NPO Temperature Spikes Noted:  No Respiratory Status: Room air History of Recent Intubation: No Behavior/Cognition: Alert;Cooperative Oral Cavity - Dentition: Dentures, top;Dentures, bottom Self-Feeding Abilities: Able to feed self Patient Positioning: Upright in bed Baseline Vocal Quality: Clear Volitional Cough: Strong Volitional Swallow: Able to elicit    Oral/Motor/Sensory Function Overall Oral Motor/Sensory Function: Appears within functional limits for tasks assessed   Ice Chips Ice chips: Not tested   Thin Liquid Thin Liquid: Within functional limits Presentation: Straw          Puree Puree: Within functional limits Presentation: [place]. [place], [place] CCC/SLP Pager [phone]     Solid: Within functional limits Presentation: Self Fed       Juan Quam Laurice 06/17/2013,10:26 AM

## 2013-06-17 NOTE — Progress Notes (Signed)
Utilization review completed.  

## 2013-06-17 NOTE — Consult Note (Signed)
CARDIOLOGY CONSULT NOTE  Patient ID: Wendy Wade MRN: [id number] DOB/AGE: August 07, 1923 80 y.o.  Admit date: 06/16/2013 Referring Physician  [place] Primary Physician:  No PCP Per Patient Reason for Consultation  NSTEMI  HPI: Wendy Wade is a 80 year old female who I had last seen in March 2014, she has known bilateral moderate carotid artery disease and right subtalar and artery disease without significant symptoms of TIA or claudication.  She is being managed by Dr. Hassan Buckler regarding hypothyroid state.  In March 2014, while performing routine labs, she was found to be severely anemic and she was referred to be evaluated by Dr. Tyrone Sage, however patient canceled all her appointments and since then she has not been seen by anyone, and she has canceled 2 of my appointments.  Her daughter is to limit her and the care of for, but recently they have been estranged.  It appears that she has been extremely devastated by this and she has stopped taking all medications.  She was admitted to [facility] with altered mental status and was found to be severely hypothyroid, severely anemic and also cardiac markers were positive for non-ST elevation myocardial infarction.  This morning patient is much more alert, awake, is following all physicians request.  Yesterday she was belligerent.  She is able to recognize me and spoke to me about all her issues, mostly she has been psychologically depressed and stated that she did not want to see me because she has been "run down".  She denies any chest pain or shortness of breath.  She complains of abdominal discomfort.  No focal neurologic deficits.  She states that she has not been able to eat and has had significant weight loss and also loss of appetite.  Past Medical History  Diagnosis Date  . Shortness of breath   . Hypertension      Past Surgical History  Procedure Laterality Date  . Abdominal surgery  1976    tumor removed     History reviewed. No pertinent family history.   Social History: History   Social History  . Marital Status: Single    Spouse Name: N/A    Number of Children: N/A  . Years of Education: N/A   Occupational History  . Not on file.   Social History Main Topics  . Smoking status: Never Smoker   . Smokeless tobacco: Not on file  . Alcohol Use: No  . Drug Use: No  . Sexual Activity: Not on file   Other Topics Concern  . Not on file   Social History Narrative  . No narrative on file     Prescriptions prior to admission  Medication Sig Dispense Refill  . atorvastatin (LIPITOR) 10 MG tablet Take by mouth daily.        Scheduled Meds: . aspirin  81 mg Oral Daily  . atorvastatin  10 mg Oral q1800  . levothyroxine  50 mcg Oral QAC breakfast  . [START ON 06/18/2013] pantoprazole  40 mg Oral Q0600   Continuous Infusions: . sodium chloride 50 mL/hr at 06/17/13 1700   PRN Meds:.acetaminophen, nitroGLYCERIN, ondansetron (ZOFRAN) IV  ROS: General: no fevers/chills/night sweats Eyes: no blurry vision, diplopia, or amaurosis ENT: no sore throat or hearing loss Resp: no cough, wheezing, or hemoptysis CV: no edema or palpitations Neuro: no headache, numbness, tingling, or weakness of extremities, was confused yesterday and beligerent in the ED. Musculoskeletal: no joint pain or swelling Heme: no bleeding, DVT, or easy bruising  Endo: no polydipsia or polyuria    Physical Exam: Blood pressure 122/76, pulse 66, temperature 98.1 F (36.7 C), temperature source Oral, resp. rate 19, height 5\' 2"  (1.575 m), weight 42.2 kg (93 lb 0.6 oz), SpO2 98.00%.   General: Moderately built and appears waster and appears to have aged significantly since I last saw her in March 2014.  who is in no acute distress. Appears stated age. Alert Ox3.   There is no cyanosis. HEENT: normal limits. No JVD.   CARDIAC EXAM: S1, S2 normal, no gallop present. No murmur.   CHEST EXAM: No tenderness  of chest wall. LUNGS: Clear to percuss and auscultate.  ABDOMEN: No hepatosplenomegaly. BS normal in all 4 quadrants. Umbilicus is firm and appears to have dried purulent discharge. No obvious mass.Marland Kitchen   EXTREMITY: Full range of movementes, No edema. MUSCULOSKELETAL EXAM: Intact with full range of motion in all 4 extremities.   NEUROLOGIC EXAM: Grossly intact without any focal deficits. Alert O x 3.   VASCULAR EXAM: No skin breakdown. Carotids prominent left carotid and soft right carotid bruit. Extremities: Femoral pulse normal. Popliteal pulse normal ; Pedal pulse normal. Otherwise No prominent pulse felt in the abdomen. No varicose veins.  Labs:   Lab Results  Component Value Date   WBC 5.7 06/17/2013   HGB 12.5 06/17/2013   HCT 38.7 06/17/2013   MCV 77.9* 06/17/2013   PLT 297 06/17/2013    Recent Labs Lab 06/17/13 0101 06/17/13 1300  NA 142 137  K 4.3 5.0  CL 106 104  CO2 22 20  BUN 12 12  CREATININE 0.83 0.83  CALCIUM 8.1* 8.0*  PROT 5.5*  --   BILITOT 0.4  --   ALKPHOS 108  --   ALT 12  --   AST 53*  --   GLUCOSE 72 61*   Lab Results  Component Value Date   TROPONINI 2.39* 06/17/2013   TROPONINI 2.85* 06/17/2013    Lipid Panel     Component Value Date/Time   CHOL 147 06/17/2013 1300   TRIG 89 06/17/2013 1300   HDL 46 06/17/2013 1300   CHOLHDL 3.2 06/17/2013 1300   VLDL 18 06/17/2013 1300   LDLCALC 83 06/17/2013 1300   Scheduled Meds: . aspirin  81 mg Oral Daily  . atorvastatin  10 mg Oral q1800  . levothyroxine  50 mcg Oral QAC breakfast  . [START ON 06/18/2013] pantoprazole  40 mg Oral Q0600   Continuous Infusions: . sodium chloride 50 mL/hr at 06/17/13 1700   PRN Meds:.acetaminophen, nitroGLYCERIN, ondansetron (ZOFRAN) IV   EKG: 06/16/2012: Normal sinus rhythm, normal axis, inferior ST segment depression T inversion, suggesting.  Ischemia.  No changes from prior EKG in my office.    Radiology:  CT abdomen: 06/17/2013: MPRESSION: 1. CT findings are  most consistent primary right ascending colonic adenocarcinoma with widespread metastatic disease including peritoneal carcinomatosis, presumably malignant ascites and extensive hepatic metastatic disease. Of note, the primary mass significantly narrows but does not obstruct the colonic lumen. The palpable periumbilical mass represent either a sister Wynona Dove metastatic lymph node, or peritoneal implant. 2. Extensive submucosal edema involving the cecum and ascending colon proximal to the mass without significant pericolonic inflammatory stranding. This is favored to reflect lymphatic congestion. A superimposed infectious or inflammatory colitis is a less likely consideration. 3. Extensive atherosclerotic vascular disease including coronary artery disease with evidence of remote myocardial infarction. 4. Additional ancillary findings as above.   Electronically Signed   By: Myrle Sheng  Laurence Ferrari M.D.   On: 06/17/2013 17:00     ASSESSMENT AND PLAN:  1.  Inferior non-ST elevation myocardial infarction 2.  Metastatic colonic cancer 3.  Asymptomatic carotid artery stenosis 4.  Severe iron deficiency anemia due to obvious GI source  Recommendation:I have had a long relationship with the patient, when patient presented to [facility] she asked for me, I requested them to transfer here to the hospital.  I have discussed with the patient that she needs to follow through with physicians advised.  At this point from cardiac standpoint I cannot really add anything much, advised him to start and continue aspirin 81 mg by mouth daily.  Obviously due to active GI bleeding, heparin is contraindicated.  We will continue with low-dose of beta blockers.  I do not think adding statins are anything active at this point given metastatic cancer, advanced age would be appropriate at this time.  I will certainly revisit her and see if comfort measures are more appropriate at this time.  I'll continue to follow  the patient.  Thank you for asking me to see the patient.  Laverda Page, MD 06/17/2013, 6:44 PM [facility]. [place] Pager: [phone] Office: [phone] If no answer Cell [phone]

## 2013-06-17 NOTE — Progress Notes (Signed)
Spoke with Dr. Olevia Bowens about pt's SBP 160's-170 range.  OK'd.  No new orders.  Will continue to monitor.

## 2013-06-17 NOTE — Progress Notes (Signed)
CRITICAL VALUE ALERT  Critical value received:  Troponin 6.42  Date of notification: Jun 17, 2013  Time of notification: 2:08am  Critical value read back:yes  Nurse who received alert:  Dora  MD notified (1st page): Schoor  Time of first page:  2:46am  MD notified (2nd page):  Time of second page:  Responding MD:  Schoor  Time MD responded:  2:57am

## 2013-06-17 NOTE — Progress Notes (Signed)
*  PRELIMINARY RESULTS* Vascular Ultrasound Lower extremity venous duplex has been completed.  Preliminary findings: Right = evidence of Subacute and Chronic DVT involving the common femoral, femoral, popliteal, posterior tibial, and peroneal veins. Left = no evidence of DVT.   Landry Mellow, RDMS, RVT  06/17/2013, 11:41 AM

## 2013-06-17 NOTE — Progress Notes (Signed)
  Echocardiogram 2D Echocardiogram has been performed.  Wendy Wade 06/17/2013, 12:12 PM

## 2013-06-17 NOTE — Consult Note (Signed)
[facility] Consult: 2:07 PM 06/17/2013  LOS: 1 day    Referring Provider: Hartford Poli Primary Care Physician:  No PCP Per Patient Primary Gastroenterologist:  unassigned Never goes to the MD   Reason for Consultation:     HPI: Wendy Wade is a 80 y.o. female.  Baseline dementia.  Hx of anemia. Had some sort of intra-abdominal tumor in the 1970s.   Brought to [facility] ED 1/26 with throat/jaw pain, chest pain. Hgb 7.5.  Pt family insisted on transfer to [place].  There is some question of chronic GI blood loss. There is "extensive " weight loss.  Hx of mass near umbilicus for 4 to 5 months.  She refuses to see the MD and cancels all her appointments.  Poor po intake. Family finally called 911 due to her chest pain and weakness.  She has ruled in for non STEMI.  Pt saw dark stool 2 weeks ago  Tested FOB negative twice today.   Transfused 2 PRBC and Hgb went to 12.5 . Denies previous transfusion. No NSAIDs PTA.  No nausea, just not hungry with poor intake for at least 7 months.  Progressive weakness, needs to hold onto furniture to move about in home. No abdominal pain.  No vomiting.  No blood in stool.     Past Medical History  Diagnosis Date  . Shortness of breath   . Hypertension     Past Surgical History  Procedure Laterality Date  . Abdominal surgery  1976    tumor removed    Prior to Admission medications   Medication Sig Start Date End Date Taking? Authorizing Provider  atorvastatin (LIPITOR) 10 MG tablet Take by mouth daily.   Yes Historical Provider, MD    Scheduled Meds: . aspirin  81 mg Oral Daily  . atorvastatin  10 mg Oral q1800  . levothyroxine  50 mcg Oral QAC breakfast  . pantoprazole (PROTONIX) IV  40 mg Intravenous Q12H  . sucralfate  1 g Oral TID WC & HS   Infusions: .  sodium chloride 75 mL/hr at 06/17/13 1109   PRN Meds: acetaminophen, nitroGLYCERIN, ondansetron (ZOFRAN) IV   Allergies as of 06/16/2013  . (No Known Allergies)    History reviewed. No pertinent family history.  History   Social History  . Marital Status: Single    Spouse Name: N/A    Number of Children: N/A  . Years of Education: N/A   Occupational History  . Not on file.   Social History Main Topics  . Smoking status: Never Smoker   . Smokeless tobacco: Not on file  . Alcohol Use: No  . Drug Use: No  . Sexual Activity: Not on file   Other Topics Concern  . Not on file   Social History Narrative  . No narrative on file    REVIEW OF SYSTEMS: Swelling in right leg. No DVT on doppler study.  Not taking her regular meds at home including thyroid meds.  Many years of dysphagia.  Sedentary. Not bed bound No nose bleeds  PHYSICAL EXAM: Vital signs in last 24 hours: Filed Vitals:   06/17/13 1109  BP: 165/71  Pulse: 61  Temp: 97.8 F (36.6 C)  Resp: 18   Wt Readings from Last 3 Encounters:  06/17/13 42.2 kg (93 lb 0.6 oz)    General: thin alert but confused elderly WF Head:  No swelling   No asymmetry Eyes:  No icterus.  No pallor Ears:  HOH Nose:  No discharge Mouth:  Clear, moist Neck:  No mass, no JVD Lungs:  A few fine crackles in base Heart: RRR.  No MRG Abdomen:  ND, NT.  Soft ball diameter periumbilical mass.  Active BS.   Rectal:  Fob negative in lab.  Exam deferred   Musc/Skeltl: arthritic change in fingers, kyphosis.  Extremities:  No CCE  Neurologic:  Confused, oriented to self only. In appropriate responses to questions.  Moves all 4s.  No asterixis.  Skin:  No telangectasia Tattoos:  none Nodes:  No cervical adenopathy   Psych:  Pleasant, relaxed.    Intake/Output from previous day: 01/26 0701 - 01/27 0700 In: 298.8 [I.V.:286.3; Blood:12.5] Out: -  Intake/Output this shift: Total I/O In: 1421.3 [I.V.:388.8; Blood:312.5;  Other:720] Out: 100 [Urine:100]  LAB RESULTS:  Recent Labs  06/17/13 0101 06/17/13 1300  WBC 5.9 5.7  HGB 7.7* 12.5  HCT 27.6* 38.7  PLT 451* 297   BMET Lab Results  Component Value Date   NA 142 06/17/2013   K 4.3 06/17/2013   CL 106 06/17/2013   CO2 22 06/17/2013   GLUCOSE 72 06/17/2013   BUN 12 06/17/2013   CREATININE 0.83 06/17/2013   CALCIUM 8.1* 06/17/2013   LFT  Recent Labs  06/17/13 0101  PROT 5.5*  ALBUMIN 2.6*  AST 53*  ALT 12  ALKPHOS 108  BILITOT 0.4   PT/INR Lab Results  Component Value Date   INR 1.02 06/17/2013   INR 1.09 06/17/2013    RADIOLOGY STUDIES: No results found.  ENDOSCOPIC STUDIES: None   IMPRESSION:   *  Microcytic anemia. 2/2 stools FOBT negative. Has had vigorous response to 2 units of blood.   *  Abdominal masses. CT abdomen and pelvis not yet read.   *  Dementia, progressive mental and physical decline.    *  Non STEMI with chest, jaw, throat pain.   *  Hypothyroidism.  Off her meds for ? How long.  T3, free T4  low.     PLAN:     *  Await CT report for assessment of abdominal mass   Azucena Freed  06/17/2013, 2:07 PM Pager: [phone]  GI ATTENDING  History, laboratories, x-rays reviewed. Patient personally seen and examined. Family in room. Agree with H&P as outlined above. Asked to the patient for reports of dark stool. As well anemia. Has been transfused. Now stable without active bleeding and Hemoccult negative stool. Abdominal exam worrisome for metastatic cancer. CT scan (reviewed after seeing the patient) shows right colon cancer with metastatic disease throughout the abdomen and liver  Impression 1. Widespread metastatic colon cancer involving the abdomen and liver 2. Advanced age with dementia  Recommendations 1. No need for colonoscopy. It adds nothing. 2. Palliative care would be appropriate  Will sign off. Thank you  Docia Chuck. Geri Seminole., M.D. [facility] Division of Gastroenterology

## 2013-06-17 NOTE — Progress Notes (Addendum)
Pt transferred from 5W around 0330, admitted to Rm/3s08. She is alert and oriented x4  but very forgetful. Ambulates independently at home per family. No skin breakdown noted, scattered bruising. Placed on telemetry, currently NSR. Unable to place Foley cath after attempting with 2RNs, Dr Roel Cluck aware. Ok to leave Foley out. Oriented to room, instructed to call for assistance before getting out of bed. Resting comfortably at this time Started transfusing first unit of blood at 0545, tolerating well at this time. No s/s of reaction, no SOB; pt sleeping. Will continue monitoring

## 2013-06-17 NOTE — Progress Notes (Addendum)
[facility] PROGRESS NOTE Interim History: 80 y.o. female has a past medical history of Shortness of breath and Hypertension patient refused to father evaluation at [facility] and was transferred to [facility] cone. I spoke to Dr. Nadyne Coombes who at this point recommends aspirin 81 mg a day and GI workup for possible chronic GI blood loss. Family states that she has not had any blood per rectum, but has had black stools for past 2 weeks, mass around her umbilicus it has been there for past 4-5 months. Patient has had extensive weight loss   Assessment/Plan: NSTEMI (non-ST elevated myocardial infarction) - Dr. Nadyne Coombes at this point recommends aspirin 81 mg, no heparin given GI bleed. - Dr. Nadyne Coombes at this point Does not think she is not a good candidate for cardiac catheterization at this point. - Pt is hemodynamically stable. - Difficult situation as she cannot be on heparin and is curently having a acute GI bleed, her anemia might be contributing to her NSTEMI. Which she is getting 2 units, will try to keep Hbg closer to 10. - Only NSAID's baby ASA.  Anemia Acute GI bleed: - Possibly due to upper GI bleed with given history of melena for the past 2 weeks. On ASA due NSTEMI, no heparin. - Transfuse 2 units given symptomatic anemia. Keep Hbg >10 gr. - Protonix IV twice a day and sucralfate., GI consult. - Anemia panel.  Abdominal mass: - CT scan of abdomen pending.  Altered mental status/Dementia with behavioral disturbance - stable.       Code Status: full Family Communication: son  Disposition Plan: inpatient   Consultants:  GI  Cardiology  Procedures:  CT abdomen and pelvis 1.27.2014:  Antibiotics:  none  HPI/Subjective: Pt complaining of weakness.  Objective: Filed Vitals:   06/17/13 0020 06/17/13 0323 06/17/13 0537 06/17/13 0600  BP: 86/53 129/51 131/70 136/61  Pulse: 73 55 72 74  Temp: 97.9 F (36.6 C) 97.7 F (36.5 C) 97.7 F (36.5 C) 98.1 F  (36.7 C)  TempSrc: Oral Oral Axillary Axillary  Resp: 18 16 17 17   Height:  5\' 2"  (1.575 m)    Weight:  42.2 kg (93 lb 0.6 oz)    SpO2:  98% 99% 99%    Intake/Output Summary (Last 24 hours) at 06/17/13 0734 Last data filed at 06/17/13 0545  Gross per 24 hour  Intake   12.5 ml  Output      0 ml  Net   12.5 ml   Filed Weights   06/16/13 2109 06/17/13 0323  Weight: 42.1 kg (92 lb 13 oz) 42.2 kg (93 lb 0.6 oz)    Exam:  General: Alert, awake, oriented x3, in no acute distress.  HEENT: No bruits, no goiter.  Heart: Regular rate and rhythm, without murmurs, rubs, gallops.  Lungs: Good air movement, CTA B/L  Abdomen: Soft, epigastric tenderness, nondistended, positive bowel sounds.  Neuro: Grossly intact, nonfocal.   Data Reviewed: Basic Metabolic Panel:  Recent Labs Lab 06/17/13 0101  NA 142  K 4.3  CL 106  CO2 22  GLUCOSE 72  BUN 12  CREATININE 0.83  CALCIUM 8.1*   Liver Function Tests:  Recent Labs Lab 06/17/13 0101  AST 53*  ALT 12  ALKPHOS 108  BILITOT 0.4  PROT 5.5*  ALBUMIN 2.6*   No results found for this basename: LIPASE, AMYLASE,  in the last 168 hours No results found for this basename: AMMONIA,  in the last 168 hours CBC:  Recent  Labs Lab 06/17/13 0101  WBC 5.9  NEUTROABS 3.7  HGB 7.7*  HCT 27.6*  MCV 72.6*  PLT 451*   Cardiac Enzymes:  Recent Labs Lab 06/17/13 0101  TROPONINI 6.42*   BNP (last 3 results) No results found for this basename: PROBNP,  in the last 8760 hours CBG: No results found for this basename: GLUCAP,  in the last 168 hours  Recent Results (from the past 240 hour(s))  MRSA PCR SCREENING     Status: None   Collection Time    06/17/13  3:07 AM      Result Value Range Status   MRSA by PCR NEGATIVE  NEGATIVE Final   Comment:            The GeneXpert MRSA Assay (FDA     approved for NASAL specimens     only), is one component of a     comprehensive MRSA colonization     surveillance program. It is not      intended to diagnose MRSA     infection nor to guide or     monitor treatment for     MRSA infections.     Studies: No results found.  Scheduled Meds: . aspirin  81 mg Oral Daily  . atorvastatin  10 mg Oral q1800  . iohexol  25 mL Oral Q1 Hr x 2  . levothyroxine  50 mcg Oral QAC breakfast  . pantoprazole (PROTONIX) IV  40 mg Intravenous Q12H   Continuous Infusions: . sodium chloride Stopped (06/17/13 0547)     Charlynne Cousins  [facility] Pager [phone]. If 8PM-8AM, please contact night-coverage at [email or web address], password [facility] 06/17/2013, 7:34 AM  LOS: 1 day

## 2013-06-18 DIAGNOSIS — R799 Abnormal finding of blood chemistry, unspecified: Secondary | ICD-10-CM

## 2013-06-18 DIAGNOSIS — R531 Weakness: Secondary | ICD-10-CM

## 2013-06-18 DIAGNOSIS — Z515 Encounter for palliative care: Secondary | ICD-10-CM

## 2013-06-18 LAB — TYPE AND SCREEN
ABO/RH(D): A POS
ANTIBODY SCREEN: NEGATIVE
UNIT DIVISION: 0
Unit division: 0

## 2013-06-18 LAB — IRON AND TIBC
Iron: <10 ug/dL — ABNORMAL LOW (ref 42–135)
UIBC: 273 ug/dL (ref 125–400)

## 2013-06-18 LAB — FOLATE: Folate: 12.5 ng/mL

## 2013-06-18 LAB — TROPONIN I: Troponin I: 2.42 ng/mL (ref <0.30)

## 2013-06-18 LAB — VITAMIN B12: Vitamin B-12: 342 pg/mL (ref 211–911)

## 2013-06-18 LAB — GLUCOSE, CAPILLARY
Glucose-Capillary: 73 mg/dL (ref 70–99)
Glucose-Capillary: 82 mg/dL (ref 70–99)

## 2013-06-18 LAB — PREALBUMIN: Prealbumin: 8.1 mg/dL — ABNORMAL LOW (ref 17.0–34.0)

## 2013-06-18 LAB — FERRITIN: FERRITIN: 29 ng/mL (ref 10–291)

## 2013-06-18 MED ORDER — SENNOSIDES-DOCUSATE SODIUM 8.6-50 MG PO TABS
2.0000 | ORAL_TABLET | Freq: Every evening | ORAL | Status: DC | PRN
  Filled 2013-06-18: qty 2

## 2013-06-18 MED ORDER — PANTOPRAZOLE SODIUM 40 MG PO PACK
40.0000 mg | PACK | Freq: Every day | ORAL | Status: DC
  Administered 2013-06-18 – 2013-06-20 (×3): 40 mg via ORAL
  Filled 2013-06-18 (×5): qty 20

## 2013-06-18 NOTE — Consult Note (Signed)
Patient OF:BPZWCH M Tullis      DOB: 03/01/1924      ENI:[id number]     Consult Note from the Palliative Medicine Team at [facility] Requested by: Dr. Darrick Meigs     PCP: No PCP Per Patient Reason for Consultation: Clarification GOC and options.   Phone Number:[phone]  Assessment of patients Current state: Ms. Crownover is a 80 yo female with NSTEMI, DVT, metastatic colonic adenocarcinoma per CT. I had a long discussion with Ms. Driskill and her two sons Thurmon Fair and Luciana Axe [phone]) about her goals of care. She says she wants Luciana Axe to make decisions for her if she is unable to make them for herself. Ms. Scorsone is struggling with her prognosis and says repeatedly during our conversation "I need more time." We discussed that we should focus on the time she does have left and how she should spend this time. She is insistent that she is going home and I told her that she needs 24/7 caregivers in order to go home. I tried to explain that we believe she will decline and get weaker and will need more and more assistance given her weak heart and cancer. She keeps saying that her children will care for her at home but her two sons present say that they both work and cannot be there 24/7. She has one daughter who has been estranged from her mother for the past ~6 months - and is apparently a bad situation. Ms. Wampole tells me about her daughter and how her daughter will come and help but her daughter has been refusing to have anything to do with her family. I attempted to refocus Ms. Mcelrath during the conversation but she continued to discuss her life story and elaborate about her past. She says "I've had a hard life, but I've had a good life" numerous times. She speaks extensively about her church involvement and bringing her children up in church and trying to be a good mother. She is tearful. She speaks of changing her will and unfinished business (I believe she is referring to her daughter). I encourage  her to handle her affairs as soon as possible. I do not believe she fully grasps the gravity of her prognosis and can understand the reality of her situation and disposition. Her sons confirmed DNR as Ms. Benegas is unable to commit to any decisions. I had great difficulty obtaining time to speak and address goals with Ms. Barnet Pall. I will continue to follow and support Ms. Mastro and further elicit goals with the help of her sons.    Goals of Care: 1.  Code Status: DNR   2. Scope of Treatment: To be determined on outcomes and further conversation.    4. Disposition: To be determined.    3. Symptom Management:   1. Bowel Regimen: Senna S prn.  2. Weakness: Continue medical management. Consider PT eval.   4. Psychosocial: Emotional support given to patient and her sons during difficult conversation.   5. Spiritual: She is very religious and I will offer chaplain support to her.    Brief HPI: 80 yo with NSTEMI, DVT, and metastatic colonic adenocarcinoma as evidenced on CT.   ROS: Denies pain, nausea, difficulty breathing    PMH:  Past Medical History  Diagnosis Date  . Shortness of breath   . Hypertension      PSH: Past Surgical History  Procedure Laterality Date  . Abdominal surgery  1976    tumor  removed   I have reviewed the FH and SH and  If appropriate update it with new information. No Known Allergies Scheduled Meds: . levothyroxine  50 mcg Oral QAC breakfast  . pantoprazole sodium  40 mg Oral Daily   Continuous Infusions: . sodium chloride 50 mL/hr at 06/17/13 2300   PRN Meds:.acetaminophen, ondansetron (ZOFRAN) IV    BP 159/71  Pulse 66  Temp(Src) 97.7 F (36.5 C) (Oral)  Resp 16  Ht 5\' 2"  (1.575 m)  Wt 42.2 kg (93 lb 0.6 oz)  BMI 17.01 kg/m2  SpO2 97%   PPS: 30%   Intake/Output Summary (Last 24 hours) at 06/18/13 1340 Last data filed at 06/18/13 1300  Gross per 24 hour  Intake 1378.33 ml  Output    700 ml  Net 678.33 ml   LBM: 06/17/13                Physical Exam:  General: NAD, frail, ill appearing HEENT: Temporal muscle wasting, no JVD Chest: CTA throughout, no labored breathes CVS: RRR, S1 S2 Abdomen: Soft, slightly tender, ND, +BS Ext: MAE, no edema Neuro: A&Ox3, easily distracted, difficult to focus  Labs: CBC    Component Value Date/Time   WBC 6.3 06/17/2013 1841   RBC 4.96 06/17/2013 1841   RBC 3.80* 06/17/2013 0101   HGB 12.3 06/17/2013 1841   HCT 38.4 06/17/2013 1841   PLT 342 06/17/2013 1841   MCV 77.4* 06/17/2013 1841   MCH 24.8* 06/17/2013 1841   MCHC 32.0 06/17/2013 1841   RDW 20.4* 06/17/2013 1841   LYMPHSABS 1.4 06/17/2013 0101   MONOABS 0.6 06/17/2013 0101   EOSABS 0.1 06/17/2013 0101   BASOSABS 0.1 06/17/2013 0101    BMET    Component Value Date/Time   NA 137 06/17/2013 1300   K 5.0 06/17/2013 1300   CL 104 06/17/2013 1300   CO2 20 06/17/2013 1300   GLUCOSE 61* 06/17/2013 1300   BUN 12 06/17/2013 1300   CREATININE 0.83 06/17/2013 1300   CALCIUM 8.0* 06/17/2013 1300   GFRNONAA 62* 06/17/2013 1300   GFRAA 71* 06/17/2013 1300    CMP     Component Value Date/Time   NA 137 06/17/2013 1300   K 5.0 06/17/2013 1300   CL 104 06/17/2013 1300   CO2 20 06/17/2013 1300   GLUCOSE 61* 06/17/2013 1300   BUN 12 06/17/2013 1300   CREATININE 0.83 06/17/2013 1300   CALCIUM 8.0* 06/17/2013 1300   PROT 5.5* 06/17/2013 0101   ALBUMIN 2.6* 06/17/2013 0101   AST 53* 06/17/2013 0101   ALT 12 06/17/2013 0101   ALKPHOS 108 06/17/2013 0101   BILITOT 0.4 06/17/2013 0101   GFRNONAA 62* 06/17/2013 1300   GFRAA 71* 06/17/2013 1300     Time In Time Out Total Time Spent with Patient Total Overall Time  1200 1340 52min 121min    Greater than 50%  of this time was spent counseling and coordinating care related to the above assessment and plan.  Vinie Sill, NP Palliative Medicine Team Pager # [phone] Team Phone # [phone]

## 2013-06-18 NOTE — Progress Notes (Addendum)
Subjective:  Feels weak, no specific complaints.  Objective:  Vital Signs in the last 24 hours: Temp:  [97.4 F (36.3 C)-98.2 F (36.8 C)] 97.7 F (36.5 C) (01/28 0743) Pulse Rate:  [49-70] 49 (01/28 0335) Resp:  [16-19] 16 (01/28 0335) BP: (122-173)/(60-76) 133/60 mmHg (01/28 0335) SpO2:  [97 %-99 %] 97 % (01/28 0335)  Intake/Output from previous day: 01/27 0701 - 01/28 0700 In: 2449.6 [P.O.:120; I.V.:1297.1; Blood:312.5] Out: 600 [Urine:600]  Physical Exam: Moderately built and appears waster and appears to have aged significantly since I last saw her in March 2014. who is in no acute distress. Appears stated age. Alert Ox3.  There is no cyanosis. HEENT: normal limits. No JVD.  CARDIAC EXAM: S1, S2 normal, no gallop present. No murmur.  CHEST EXAM: No tenderness of chest wall. LUNGS: Clear to percuss and auscultate.  ABDOMEN: No hepatosplenomegaly. BS normal in all 4 quadrants. Umbilicus is firm and appears to have dried purulent discharge. No obvious mass.Marland Kitchen  EXTREMITY: Full range of movementes, No edema.NEUROLOGIC EXAM: Grossly intact without any focal deficits. Alert O x 3.   Lab Results:  Recent Labs  06/17/13 1300 06/17/13 1841  WBC 5.7 6.3  HGB 12.5 12.3  PLT 297 342    Recent Labs  06/17/13 0101 06/17/13 1300  NA 142 137  K 4.3 5.0  CL 106 104  CO2 22 20  GLUCOSE 72 61*  BUN 12 12  CREATININE 0.83 0.83    Recent Labs  06/17/13 1841 06/17/13 2356  TROPONINI 2.37* 2.42*   Hepatic Function Panel  Recent Labs  06/17/13 0101  PROT 5.5*  ALBUMIN 2.6*  AST 53*  ALT 12  ALKPHOS 108  BILITOT 0.4    Recent Labs  06/17/13 1300  CHOL 147   No results found for this basename: PROTIME,  in the last 72 hours Lipid Panel     Component Value Date/Time   CHOL 147 06/17/2013 1300   TRIG 89 06/17/2013 1300   HDL 46 06/17/2013 1300   CHOLHDL 3.2 06/17/2013 1300   VLDL 18 06/17/2013 1300   LDLCALC 83 06/17/2013 1300   Echocardiogram 06/17/2013 : No  significant wall motion abnormality, preserved ejection fraction. Calcified aortic valve mitral valve without stenosis. Moderate pulmonary hypertension. Lower extremity venous duplex same day: DVT subacute to chronic right leg.  Assessment/Plan:  1. NSTEMI 2. DVT 3. Colonic cancer with extensive metastases  Recommendation: I had a long discussion with the patient regarding end-of-life. At this point there is no further evaluation or therapy is indicated. Although patient's chart dictates dementia, I have known her for many years, she was very alert and oriented with me. She completely understands that she is not much to live.  I will discuss with her son to see whether there is any social support. Patient has lived independently until now. Her daughter is to lose her, but has been missing for the past 6-7 months, doesn't appear that she was to come back to take care of her mother. Hence she may need to be transferred to hospice at the [facility] place, as the survival over the next 2-3 months is probably nonexistent.  No further blood transfusions is indicated, I will discontinue cardiac medications. I be available to have been any social issues that may arise. Patient's nurse was aware and was present during the discussion of end-of-life.   Addendum: I discussed with Rhodesia Stanger in person, her son and further evaluation by hospice and recommendations to be awaited down.  His cell number is [phone]   Laverda Page, M.D. 06/18/2013, 8:51 AM [facility] Pager: [phone] Office: [phone] If no answer: [phone]

## 2013-06-18 NOTE — Progress Notes (Signed)
[facility] PROGRESS NOTE Interim History: 80 y.o. female has a past medical history of Shortness of breath and Hypertension patient refused to father evaluation at [facility] and was transferred to [facility]. I spoke to Dr. Nadyne Coombes who at this point recommends aspirin 81 mg a day and GI workup for possible chronic GI blood loss. Family states that she has not had any blood per rectum, but has had black stools for past 2 weeks, mass around her umbilicus it has been there for past 4-5 months. Patient has had extensive weight loss   Assessment/Plan:  NSTEMI (non-ST elevated myocardial infarction) - Dr. Nadyne Coombes at this point recommends aspirin 81 mg, no heparin given GI bleed. - Dr. Nadyne Coombes at this point Does not think she is not a good candidate for cardiac catheterization at this point. - Pt is hemodynamically stable.  Anemia Acute GI bleed: - Possibly due to upper GI bleed with given history of melena for the past 2 weeks. On ASA due NSTEMI, no heparin. - Transfused 2 units PRBC yesterday and today Hb is 12.3 - GI following  Metastatic colon cancer involving the abdomen and liver - Discussed with family, no aggressive interventions recommended by GI. Will get palliative care consultation.  Altered mental status/Dementia with behavioral disturbance - stable.      Code Status: full Family Communication: son  Disposition Plan: inpatient   Consultants:  GI  Cardiology  Procedures:  CT abdomen and pelvis 1.27.2014:  Antibiotics:  none  HPI/Subjective: Patient seen and examined, denies any new complaints.  Objective: Filed Vitals:   06/18/13 0400 06/18/13 0732 06/18/13 1103 06/18/13 1457  BP:  158/63 159/71   Pulse:  52 66   Temp: 97.8 F (36.6 C) 97.7 F (36.5 C) 97.7 F (36.5 C) 99.2 F (37.3 C)  TempSrc: Oral Oral Oral Oral  Resp:  12 16   Height:      Weight:      SpO2:  97% 97%     Intake/Output Summary (Last 24 hours) at 06/18/13  1504 Last data filed at 06/18/13 1300  Gross per 24 hour  Intake 1378.33 ml  Output    700 ml  Net 678.33 ml   Filed Weights   06/16/13 2109 06/17/13 0323  Weight: 42.1 kg (92 lb 13 oz) 42.2 kg (93 lb 0.6 oz)    Exam:  Physical Exam: Head: Normocephalic, atraumatic.  Eyes: No signs of jaundice, EOMI Nose: Mucous membranes dry.  Throat: Oropharynx nonerythematous, no exudate appreciated.  Neck: supple,No deformities, masses, or tenderness noted. Lungs: Normal respiratory effort. B/L Clear to auscultation, no crackles or wheezes.  Heart: Regular RR. S1 and S2 normal  Abdomen: BS normoactive. Soft, Nondistended, non-tender.  Extremities: No pretibial edema, no erythema    Data Reviewed: Basic Metabolic Panel:  Recent Labs Lab 06/17/13 0101 06/17/13 1300  NA 142 137  K 4.3 5.0  CL 106 104  CO2 22 20  GLUCOSE 72 61*  BUN 12 12  CREATININE 0.83 0.83  CALCIUM 8.1* 8.0*   Liver Function Tests:  Recent Labs Lab 06/17/13 0101  AST 53*  ALT 12  ALKPHOS 108  BILITOT 0.4  PROT 5.5*  ALBUMIN 2.6*   No results found for this basename: LIPASE, AMYLASE,  in the last 168 hours No results found for this basename: AMMONIA,  in the last 168 hours CBC:  Recent Labs Lab 06/17/13 0101 06/17/13 1300 06/17/13 1841  WBC 5.9 5.7 6.3  NEUTROABS 3.7  --   --  HGB 7.7* 12.5 12.3  HCT 27.6* 38.7 38.4  MCV 72.6* 77.9* 77.4*  PLT 451* 297 342   Cardiac Enzymes:  Recent Labs Lab 06/17/13 0101 06/17/13 1300 06/17/13 1841 06/17/13 2356  TROPONINI 6.42* 2.39*  2.85* 2.37* 2.42*   BNP (last 3 results) No results found for this basename: PROBNP,  in the last 8760 hours CBG:  Recent Labs Lab 06/17/13 2208 06/18/13 0454  GLUCAP 73 82    Recent Results (from the past 240 hour(s))  MRSA PCR SCREENING     Status: None   Collection Time    06/17/13  3:07 AM      Result Value Range Status   MRSA by PCR NEGATIVE  NEGATIVE Final   Comment:            The GeneXpert  MRSA Assay (FDA     approved for NASAL specimens     only), is one component of a     comprehensive MRSA colonization     surveillance program. It is not     intended to diagnose MRSA     infection nor to guide or     monitor treatment for     MRSA infections.  CLOSTRIDIUM DIFFICILE BY PCR     Status: None   Collection Time    06/17/13 11:12 AM      Result Value Range Status   C difficile by pcr NEGATIVE  NEGATIVE Final     Studies: Ct Abdomen Pelvis W Contrast  06/17/2013   CLINICAL DATA:  Palpable paraumbilical mass  EXAM: CT ABDOMEN AND PELVIS WITH CONTRAST  TECHNIQUE: Multidetector CT imaging of the abdomen and pelvis was performed using the standard protocol following bolus administration of intravenous contrast.  CONTRAST:  164mL OMNIPAQUE IOHEXOL 300 MG/ML  SOLN  COMPARISON:  None.  FINDINGS: Lower Chest: Patient and respiratory motion results in significant motion artifact in the lung bases. Calcified pulmonary nodules noted in the posterior left lung, likely old granulomas are sequelae of chronic small volume aspiration. There are small bilateral pleural effusions and associated trace dependent atelectasis. Visualized cardiac structures are within normal limits for size. Atherosclerotic calcifications noted throughout the coronary arteries. Fibro fatty remodeling noted in the knee left ventricular endocardium suggesting prior myocardial infarction. Unremarkable visualized thoracic esophagus.  Abdomen/Pelvis: Unremarkable appearance of the stomach, duodenum, adrenal glands and pancreas. Multiple calcified granulomas noted throughout the spleen. Multiple large peripherally enhancing centrally hypo attenuating and likely necrotic hepatic masses concerning for metastatic disease. The largest mass measures up to 8 point by 8.3 cm in the inferior right hepatic lobe. The largest mass in the left hepatic lobe measures approximately 5.5 x 5.1 cm. In addition to the peripherally enhancing centrally  necrotic hepatic masses there are several circumscribed low-attenuation hepatic lesions which are more consistent with simple cysts. The liver contour is unremarkable and does not appear cirrhotic. Portal veins remain patent without evidence of macro vascular invasion.  No hydronephrosis or nephrolithiasis. No enhancing renal mass. 2.2 cm simple cyst exophytic from the upper pole of well left kidney. Additional circumscribed low-attenuation lesions in both kidneys are too small to characterize but statistically highly likely benign cysts.  Irregular circumferential masslike wall thickening of the ascending colon just proximal to the hepatic flexure. The mass measures approximately 3.6 cm in length and significantly narrows of the colonic lumen. Proximal to this, the cecum and ascending colon demonstrate extensive submucosal edema and and almost cobblestone appearance. Haziness and stranding in the pericolonic fat concerning  for tumor infiltration. There is an 11 mm pericolonic lymph node versus metastatic focus in the right pericolic gutter on image 43. Large enhancing soft tissue mass in the right medial inguinal recess measures 4.6 by 3.0 cm. The soft tissue mass abuts and is inseparable to the adjacent redundant sigmoid colon. It may be arising from the sigmoid colon or reflect a peritoneal implant. Colonic diverticular disease without CT evidence of active inflammation. Highly redundant sigmoid colon. There is a 12 mm enhancing peritoneal implant versus periumbilical node node just deep to the umbilicus. Small the moderate volume of perihepatic, perisplenic and pelvic ascites is highly likely malignant. Additional scattered mesenteric / peritoneal implants versus mesenteric adenopathy are noted.  Surgical changes of prior hysterectomy. Unremarkable appearance of the bladder.  Bones/Soft Tissues: Extensive multilevel degenerative disc disease and spondylosis throughout the spine. No definite lytic osseous lesion.  There is focal sclerosis lateral aspect of the left seventh rib of which could represent a remote healed fracture or potentially a blastic lesion.  Vascular: Extensive atherosclerotic vascular disease without aneurysmal dilatation.  IMPRESSION: 1. CT findings are most consistent primary right ascending colonic adenocarcinoma with widespread metastatic disease including peritoneal carcinomatosis, presumably malignant ascites and extensive hepatic metastatic disease. Of note, the primary mass significantly narrows but does not obstruct the colonic lumen. The palpable periumbilical mass represent either a sister Wynona Dove metastatic lymph node, or peritoneal implant. 2. Extensive submucosal edema involving the cecum and ascending colon proximal to the mass without significant pericolonic inflammatory stranding. This is favored to reflect lymphatic congestion. A superimposed infectious or inflammatory colitis is a less likely consideration. 3. Extensive atherosclerotic vascular disease including coronary artery disease with evidence of remote myocardial infarction. 4. Additional ancillary findings as above.   Electronically Signed   By: Jacqulynn Cadet M.D.   On: 06/17/2013 17:00    Scheduled Meds: . levothyroxine  50 mcg Oral QAC breakfast  . pantoprazole sodium  40 mg Oral Daily   Continuous Infusions: . sodium chloride 50 mL/hr at 06/17/13 [facility] Pager [phone]. If 8PM-8AM, please contact night-coverage at [email or web address], password [facility] 06/18/2013, 3:04 PM  LOS: 2 days

## 2013-06-19 DIAGNOSIS — R5383 Other fatigue: Secondary | ICD-10-CM

## 2013-06-19 DIAGNOSIS — R5381 Other malaise: Secondary | ICD-10-CM

## 2013-06-19 NOTE — Progress Notes (Signed)
Progress Note from the Palliative Medicine Team at [facility]  Subjective: I spoke with both sons. They want to try and respect their mother's wishes and try and get her home. They say that they want to talk with [facility] and see what kind of support they can get for in the home. We did discuss residential hospice but they want to try and get her home with hospice and 24/7 care. I explained that comfort care is reasonable given their mother's poor prognosis and they agreed bringing her back and forth to the hospital is not in her best interest at this time. They wish for home with hospice care and making sure her needs are met and she is comfortable. I believe Wendy Wade is too emotionally distracted to comprehend the gravity of these decisions. She is tearful and speaks about her daughter during the entire conversation yesterday and could not focus on her own health situation and needs.   Wendy Wade tells me stories about her life during my visit. She is unable to focus on the reality of her current health condition. I will continue to follow and support and speak with her sons about planning for her.   Objective: No Known Allergies Scheduled Meds: . pantoprazole sodium  40 mg Oral Daily   Continuous Infusions: . sodium chloride 50 mL/hr at 06/18/13 2300   PRN Meds:.acetaminophen, ondansetron (ZOFRAN) IV, senna-docusate  BP 121/75  Pulse 58  Temp(Src) 97.3 F (36.3 C) (Oral)  Resp 17  Ht 5\' 2"  (1.575 m)  Wt 42.2 kg (93 lb 0.6 oz)  BMI 17.01 kg/m2  SpO2 95%   PPS: 30%    Intake/Output Summary (Last 24 hours) at 06/19/13 0917 Last data filed at 06/19/13 0800  Gross per 24 hour  Intake   1390 ml  Output    875 ml  Net    515 ml      LBM: 06/17/13     Physical Exam:  General: NAD, frail, ill appearing  HEENT: Temporal muscle wasting, no JVD  Chest: CTA throughout, no labored breathes  CVS: RRR, S1 S2  Abdomen: Soft, slightly tender, ND, +BS  Ext: MAE, no edema   Neuro: A&Ox3, easily distracted, difficult to focus   Labs: CBC    Component Value Date/Time   WBC 6.3 06/17/2013 1841   RBC 4.96 06/17/2013 1841   RBC 3.80* 06/17/2013 0101   HGB 12.3 06/17/2013 1841   HCT 38.4 06/17/2013 1841   PLT 342 06/17/2013 1841   MCV 77.4* 06/17/2013 1841   MCH 24.8* 06/17/2013 1841   MCHC 32.0 06/17/2013 1841   RDW 20.4* 06/17/2013 1841   LYMPHSABS 1.4 06/17/2013 0101   MONOABS 0.6 06/17/2013 0101   EOSABS 0.1 06/17/2013 0101   BASOSABS 0.1 06/17/2013 0101    BMET    Component Value Date/Time   NA 137 06/17/2013 1300   K 5.0 06/17/2013 1300   CL 104 06/17/2013 1300   CO2 20 06/17/2013 1300   GLUCOSE 61* 06/17/2013 1300   BUN 12 06/17/2013 1300   CREATININE 0.83 06/17/2013 1300   CALCIUM 8.0* 06/17/2013 1300   GFRNONAA 62* 06/17/2013 1300   GFRAA 71* 06/17/2013 1300    CMP     Component Value Date/Time   NA 137 06/17/2013 1300   K 5.0 06/17/2013 1300   CL 104 06/17/2013 1300   CO2 20 06/17/2013 1300   GLUCOSE 61* 06/17/2013 1300   BUN 12 06/17/2013 1300   CREATININE 0.83 06/17/2013  1300   CALCIUM 8.0* 06/17/2013 1300   PROT 5.5* 06/17/2013 0101   ALBUMIN 2.6* 06/17/2013 0101   AST 53* 06/17/2013 0101   ALT 12 06/17/2013 0101   ALKPHOS 108 06/17/2013 0101   BILITOT 0.4 06/17/2013 0101   GFRNONAA 62* 06/17/2013 1300   GFRAA 71* 06/17/2013 1300    Assessment and Plan: 1. Code Status: DNR 2. Symptom Control: 1. Bowel Regimen: Senna S prn.  2. Weakness: Continue medical management. Consider PT eval.  3. Psycho/Social: Emotional support provided to patient and her sons. 4. Disposition: They are hopeful for home with hospice.    Time In Time Out Total Time Spent with Patient Total Overall Time  1200 1230 90min 69min    Greater than 50%  of this time was spent counseling and coordinating care related to the above assessment and plan.  Vinie Sill, NP Palliative Medicine Team Pager # [phone] Team Phone # [phone]   1

## 2013-06-19 NOTE — Progress Notes (Signed)
[facility] PROGRESS NOTE Interim History: 80 y.o. female has a past medical history of Shortness of breath and Hypertension patient refused to father evaluation at [facility] and was transferred to [facility], [place] cone. I spoke to Dr. Nadyne Coombes who at this point recommends aspirin 81 mg a day and GI workup for possible chronic GI blood loss. Family states that she has not had any blood per rectum, but has had black stools for past 2 weeks, mass around her umbilicus it has been there for past 4-5 months. Patient has had extensive weight loss. Patient found to have metastatic colon cancer with mets to abdomen and liver.   Assessment/Plan:  NSTEMI (non-ST elevated myocardial infarction) - All the cardiac medications have been discontinued after Dr Einar Gip ahd the discussion with family regarding the poor prognosis. - Goal is comfort.  Anemia Acute GI bleed: - Hemoglobin has been stable,  GI has signed off.  Metastatic colon cancer involving the abdomen and liver Discussed with family, no aggressive interventions recommended by GI.Palliative care now involved, final plan is still pending.[facility], Dr Einar Gip knows the patient and family for longtime, and after long discussion residential hospice ws recommended. Will request social work consult for the hospice and let palliative care work out the details.Discussed with palliative care team, family wants to take her home with hospice.   Altered mental status/Dementia with behavioral disturbance - stable.      Code Status: full Family Communication: son  Disposition Plan: inpatient   Consultants:  GI  Cardiology  Procedures:  CT abdomen and pelvis 1.27.2014:  Antibiotics:  none  HPI/Subjective: Patient seen and examined, denies any new complaints.  Objective: Filed Vitals:   06/18/13 2000 06/18/13 2015 06/18/13 2340 06/19/13 0000  BP:  128/79 103/57   Pulse: 66 74 53   Temp: 97.7 F (36.5 C)   98.5 F (36.9  C)  TempSrc: Oral   Oral  Resp: 14 18 14    Height:      Weight:      SpO2: 98% 99% 98%     Intake/Output Summary (Last 24 hours) at 06/19/13 0523 Last data filed at 06/19/13 0200  Gross per 24 hour  Intake   1290 ml  Output    675 ml  Net    615 ml   Filed Weights   06/16/13 2109 06/17/13 0323  Weight: 42.1 kg (92 lb 13 oz) 42.2 kg (93 lb 0.6 oz)    Exam:  Physical Exam: Head: Normocephalic, atraumatic.  Eyes: No signs of jaundice, EOMI Nose: Mucous membranes dry.  Throat: Oropharynx nonerythematous, no exudate appreciated.  Neck: supple,No deformities, masses, or tenderness noted. Lungs: Normal respiratory effort. B/L Clear to auscultation, no crackles or wheezes.  Heart: Regular RR. S1 and S2 normal  Abdomen: BS normoactive. Soft, Nondistended, non-tender.  Extremities: No pretibial edema, no erythema    Data Reviewed: Basic Metabolic Panel:  Recent Labs Lab 06/17/13 0101 06/17/13 1300  NA 142 137  K 4.3 5.0  CL 106 104  CO2 22 20  GLUCOSE 72 61*  BUN 12 12  CREATININE 0.83 0.83  CALCIUM 8.1* 8.0*   Liver Function Tests:  Recent Labs Lab 06/17/13 0101  AST 53*  ALT 12  ALKPHOS 108  BILITOT 0.4  PROT 5.5*  ALBUMIN 2.6*   No results found for this basename: LIPASE, AMYLASE,  in the last 168 hours No results found for this basename: AMMONIA,  in the last 168 hours CBC:  Recent Labs Lab  06/17/13 0101 06/17/13 1300 06/17/13 1841  WBC 5.9 5.7 6.3  NEUTROABS 3.7  --   --   HGB 7.7* 12.5 12.3  HCT 27.6* 38.7 38.4  MCV 72.6* 77.9* 77.4*  PLT 451* 297 342   Cardiac Enzymes:  Recent Labs Lab 06/17/13 0101 06/17/13 1300 06/17/13 1841 06/17/13 2356  TROPONINI 6.42* 2.39*  2.85* 2.37* 2.42*   BNP (last 3 results) No results found for this basename: PROBNP,  in the last 8760 hours CBG:  Recent Labs Lab 06/17/13 2208 06/18/13 0454  GLUCAP 73 82    Recent Results (from the past 240 hour(s))  MRSA PCR SCREENING     Status: None    Collection Time    06/17/13  3:07 AM      Result Value Range Status   MRSA by PCR NEGATIVE  NEGATIVE Final   Comment:            The GeneXpert MRSA Assay (FDA     approved for NASAL specimens     only), is one component of a     comprehensive MRSA colonization     surveillance program. It is not     intended to diagnose MRSA     infection nor to guide or     monitor treatment for     MRSA infections.  CLOSTRIDIUM DIFFICILE BY PCR     Status: None   Collection Time    06/17/13 11:12 AM      Result Value Range Status   C difficile by pcr NEGATIVE  NEGATIVE Final     Studies: Ct Abdomen Pelvis W Contrast  06/17/2013   CLINICAL DATA:  Palpable paraumbilical mass  EXAM: CT ABDOMEN AND PELVIS WITH CONTRAST  TECHNIQUE: Multidetector CT imaging of the abdomen and pelvis was performed using the standard protocol following bolus administration of intravenous contrast.  CONTRAST:  176mL OMNIPAQUE IOHEXOL 300 MG/ML  SOLN  COMPARISON:  None.  FINDINGS: Lower Chest: Patient and respiratory motion results in significant motion artifact in the lung bases. Calcified pulmonary nodules noted in the posterior left lung, likely old granulomas are sequelae of chronic small volume aspiration. There are small bilateral pleural effusions and associated trace dependent atelectasis. Visualized cardiac structures are within normal limits for size. Atherosclerotic calcifications noted throughout the coronary arteries. Fibro fatty remodeling noted in the knee left ventricular endocardium suggesting prior myocardial infarction. Unremarkable visualized thoracic esophagus.  Abdomen/Pelvis: Unremarkable appearance of the stomach, duodenum, adrenal glands and pancreas. Multiple calcified granulomas noted throughout the spleen. Multiple large peripherally enhancing centrally hypo attenuating and likely necrotic hepatic masses concerning for metastatic disease. The largest mass measures up to 8 point by 8.3 cm in the inferior  right hepatic lobe. The largest mass in the left hepatic lobe measures approximately 5.5 x 5.1 cm. In addition to the peripherally enhancing centrally necrotic hepatic masses there are several circumscribed low-attenuation hepatic lesions which are more consistent with simple cysts. The liver contour is unremarkable and does not appear cirrhotic. Portal veins remain patent without evidence of macro vascular invasion.  No hydronephrosis or nephrolithiasis. No enhancing renal mass. 2.2 cm simple cyst exophytic from the upper pole of well left kidney. Additional circumscribed low-attenuation lesions in both kidneys are too small to characterize but statistically highly likely benign cysts.  Irregular circumferential masslike wall thickening of the ascending colon just proximal to the hepatic flexure. The mass measures approximately 3.6 cm in length and significantly narrows of the colonic lumen. Proximal to this, the  cecum and ascending colon demonstrate extensive submucosal edema and and almost cobblestone appearance. Haziness and stranding in the pericolonic fat concerning for tumor infiltration. There is an 11 mm pericolonic lymph node versus metastatic focus in the right pericolic gutter on image 43. Large enhancing soft tissue mass in the right medial inguinal recess measures 4.6 by 3.0 cm. The soft tissue mass abuts and is inseparable to the adjacent redundant sigmoid colon. It may be arising from the sigmoid colon or reflect a peritoneal implant. Colonic diverticular disease without CT evidence of active inflammation. Highly redundant sigmoid colon. There is a 12 mm enhancing peritoneal implant versus periumbilical node node just deep to the umbilicus. Small the moderate volume of perihepatic, perisplenic and pelvic ascites is highly likely malignant. Additional scattered mesenteric / peritoneal implants versus mesenteric adenopathy are noted.  Surgical changes of prior hysterectomy. Unremarkable appearance of  the bladder.  Bones/Soft Tissues: Extensive multilevel degenerative disc disease and spondylosis throughout the spine. No definite lytic osseous lesion. There is focal sclerosis lateral aspect of the left seventh rib of which could represent a remote healed fracture or potentially a blastic lesion.  Vascular: Extensive atherosclerotic vascular disease without aneurysmal dilatation.  IMPRESSION: 1. CT findings are most consistent primary right ascending colonic adenocarcinoma with widespread metastatic disease including peritoneal carcinomatosis, presumably malignant ascites and extensive hepatic metastatic disease. Of note, the primary mass significantly narrows but does not obstruct the colonic lumen. The palpable periumbilical mass represent either a sister Wynona Dove metastatic lymph node, or peritoneal implant. 2. Extensive submucosal edema involving the cecum and ascending colon proximal to the mass without significant pericolonic inflammatory stranding. This is favored to reflect lymphatic congestion. A superimposed infectious or inflammatory colitis is a less likely consideration. 3. Extensive atherosclerotic vascular disease including coronary artery disease with evidence of remote myocardial infarction. 4. Additional ancillary findings as above.   Electronically Signed   By: Jacqulynn Cadet M.D.   On: 06/17/2013 17:00    Scheduled Meds: . pantoprazole sodium  40 mg Oral Daily   Continuous Infusions: . sodium chloride 50 mL/hr at 06/18/13 [facility] Pager [phone]. If 8PM-8AM, please contact night-coverage at [email or web address], password [facility] 06/19/2013, 5:23 AM  LOS: 3 days

## 2013-06-19 NOTE — Care Management Note (Signed)
Representative from [place] to meet with pt and sons in am 06/20/13 at 9:30 to discuss home with hospice option.

## 2013-06-19 NOTE — Care Management Note (Signed)
Page 1 of 2   06/20/2013     12:00:35 PM   CARE MANAGEMENT NOTE 06/20/2013  Patient:  Wendy Wade, Wendy Wade   Account Number:  [id number]  Date Initiated:  06/17/2013  Documentation initiated by:  Marvetta Gibbons  Subjective/Objective Assessment:   Pt admitted with AMS +NSTEMI     Action/Plan:   PTA pt lived at home- NCM to follow for d/c needs   Anticipated DC Date:  06/21/2013   Anticipated DC Plan:  [place]  CM consult      PAC Choice  HOSPICE   Choice offered to / List presented to:  C-4 Adult Children   DME arranged  [place]      DME agency  [place]     HH arranged  [facility]      [place] agency  [facility]   Status of service:  In process, will continue to follow Medicare Important Message given?   (If response is "NO", the following Medicare IM given date fields will be blank) Date Medicare IM given:   Date Additional Medicare IM given:    Discharge Disposition:    Per UR Regulation:  Reviewed for med. necessity/level of care/duration of stay  If discussed at [place] of Stay Meetings, dates discussed:    Comments:  Contacts: Sons- Breck- [phone] - cell,  Luciana Axe [phone]  06/20/13- 0930- Marvetta Gibbons RN, BSN [phone] Meeting this AM arranged with [facility]- she met with pt and sons in the room and then met with sons and this CM together to discuss home hospice option for discharge vs Res. Hospice vs SNF. Pt desires to return home and sons desire to respect their moms wishes- issue is that sons both work and can not provide 24/7 care at home- explained the need to find private duty assistance to cover the gaps that Hospice is not there- list of private duty agencies given to sons- discussed DME needed in the home- It was decided that the sons would try home with Hospice initially with the plan to use  today to arrange everything including private duty help with the understanding that their mom is ready for discharge per MD and the plan will be for d/c tomorrow home with Hospice and private duty help. Sons are to arrange things for a planned d/c home 1/31/15Marcene Brawn with Hospice to fax everything needed for referral to [place] and to order all needed DME through [facility]- spoke with Dr. Darrick Meigs to update on d/c plans. Per sons they will transport pt home in private vehicle   06/19/13- 1030- Marvetta Gibbons Therapist, sports, BSN [phone] Referral received regarding Home Hospice- in to speak with pt and son Breck at bedside- (pt remains pleasantly confused) per son Thurmon Fair- he and his brother would like to speak with someone from [place] regarding services for home- to see if they might be able to keep mom at home vs. needing to do residential hospice. Pt wants to return home and PTA older son Luciana Axe was staying with pt- both sons work and are unable to provide 24/7 care at home- there is a daughter but family has not heard from her in over 6 mo. Call has been made to [facility] to request someone to contact family regarding questions on Home Hospice- information faxed and they will have someone contact  the family to meet with them about services.

## 2013-06-19 NOTE — Progress Notes (Signed)
Nutrition Brief Note  Chart reviewed. Pt now transitioning to comfort - planning to d/c home with hospice. No further nutrition interventions warranted at this time.  Please re-consult as needed.   Inda Coke MS, RD, LDN Pager: [phone] After-hours pager: [phone]

## 2013-06-20 MED ORDER — LORAZEPAM 2 MG/ML IJ SOLN
0.2500 mg | Freq: Once | INTRAMUSCULAR | Status: AC
  Administered 2013-06-21: 0.25 mg via INTRAVENOUS
  Filled 2013-06-20: qty 1

## 2013-06-20 NOTE — Progress Notes (Signed)
Chaplain paged to provide support to patient. Patient's son's at bedside. Patient refusing to be provided home with hospice. Patient said "she does not strangers in her house." Chaplain offered emotional support. Patient asked chaplain "to respect her wishes and not try to change her mind on what she wants to do."   06/20/13 1100  Clinical Encounter Type  Visited With Patient and family together;Health care provider  Visit Type Initial  Referral From Nurse

## 2013-06-20 NOTE — Progress Notes (Signed)
Pt TX to 6N-10, VSS, called report.

## 2013-06-20 NOTE — Progress Notes (Signed)
[facility] PROGRESS NOTE Interim History: 80 y.o. female has a past medical history of Shortness of breath and Hypertension patient refused to father evaluation at [facility] and was transferred to [facility]. I spoke to Dr. Nadyne Coombes who at this point recommends aspirin 81 mg a day and GI workup for possible chronic GI blood loss. Family states that she has not had any blood per rectum, but has had black stools for past 2 weeks, mass around her umbilicus it has been there for past 4-5 months. Patient has had extensive weight loss. Patient found to have metastatic colon cancer with mets to abdomen and liver.   Assessment/Plan:  NSTEMI (non-ST elevated myocardial infarction) - All the cardiac medications have been discontinued after Dr Einar Gip ahd the discussion with family regarding the poor prognosis. - Goal is comfort.  Anemia Acute GI bleed: - Hemoglobin has been stable,  GI has signed off.  Metastatic colon cancer involving the abdomen and liver Discussed with family, no aggressive interventions recommended by GI.Palliative care now involved, final plan is still pending.[facility], Dr Einar Gip knows the patient and family for longtime, and after long discussion residential hospice ws recommended. Will request social work consult for the hospice and let palliative care work out the details.Discussed with palliative care team, family wants to take her home with hospice. Family wants to arrange 24/7 care, so will discharge her in am.  Altered mental status/Dementia with behavioral disturbance - stable.      Code Status: full Family Communication: son  Disposition Plan: inpatient   Consultants:  GI  Cardiology  Procedures:  CT abdomen and pelvis 1.27.2014:  Antibiotics:  none  HPI/Subjective: Patient seen and examined, denies any new complaints.  Objective: Filed Vitals:   06/20/13 0005 06/20/13 0451 06/20/13 0730 06/20/13 1100  BP: 112/62 135/77  158/71   Pulse: 66 76    Temp: 98.8 F (37.1 C) 98.2 F (36.8 C) 98 F (36.7 C) 98.6 F (37 C)  TempSrc: Oral Oral Oral Oral  Resp: 12 17    Height:      Weight:      SpO2: 97% 95%      Intake/Output Summary (Last 24 hours) at 06/20/13 1341 Last data filed at 06/20/13 1200  Gross per 24 hour  Intake   1390 ml  Output   1875 ml  Net   -485 ml   Filed Weights   06/16/13 2109 06/17/13 0323  Weight: 42.1 kg (92 lb 13 oz) 42.2 kg (93 lb 0.6 oz)    Exam:  Physical Exam: Head: Normocephalic, atraumatic.  Eyes: No signs of jaundice, EOMI Nose: Mucous membranes dry.  Throat: Oropharynx nonerythematous, no exudate appreciated.  Neck: supple,No deformities, masses, or tenderness noted. Lungs: Normal respiratory effort. B/L Clear to auscultation, no crackles or wheezes.  Heart: Regular RR. S1 and S2 normal  Abdomen: BS normoactive. Soft, Nondistended, non-tender.  Extremities: No pretibial edema, no erythema    Data Reviewed: Basic Metabolic Panel:  Recent Labs Lab 06/17/13 0101 06/17/13 1300  NA 142 137  K 4.3 5.0  CL 106 104  CO2 22 20  GLUCOSE 72 61*  BUN 12 12  CREATININE 0.83 0.83  CALCIUM 8.1* 8.0*   Liver Function Tests:  Recent Labs Lab 06/17/13 0101  AST 53*  ALT 12  ALKPHOS 108  BILITOT 0.4  PROT 5.5*  ALBUMIN 2.6*   No results found for this basename: LIPASE, AMYLASE,  in the last 168 hours No results found  for this basename: AMMONIA,  in the last 168 hours CBC:  Recent Labs Lab 06/17/13 0101 06/17/13 1300 06/17/13 1841  WBC 5.9 5.7 6.3  NEUTROABS 3.7  --   --   HGB 7.7* 12.5 12.3  HCT 27.6* 38.7 38.4  MCV 72.6* 77.9* 77.4*  PLT 451* 297 342   Cardiac Enzymes:  Recent Labs Lab 06/17/13 0101 06/17/13 1300 06/17/13 1841 06/17/13 2356  TROPONINI 6.42* 2.39*  2.85* 2.37* 2.42*   BNP (last 3 results) No results found for this basename: PROBNP,  in the last 8760 hours CBG:  Recent Labs Lab 06/17/13 2208 06/18/13 0454   GLUCAP 73 82    Recent Results (from the past 240 hour(s))  MRSA PCR SCREENING     Status: None   Collection Time    06/17/13  3:07 AM      Result Value Range Status   MRSA by PCR NEGATIVE  NEGATIVE Final   Comment:            The GeneXpert MRSA Assay (FDA     approved for NASAL specimens     only), is one component of a     comprehensive MRSA colonization     surveillance program. It is not     intended to diagnose MRSA     infection nor to guide or     monitor treatment for     MRSA infections.  CLOSTRIDIUM DIFFICILE BY PCR     Status: None   Collection Time    06/17/13 11:12 AM      Result Value Range Status   C difficile by pcr NEGATIVE  NEGATIVE Final     Studies: No results found.  Scheduled Meds: . pantoprazole sodium  40 mg Oral Daily   Continuous Infusions: . sodium chloride 50 mL/hr at 06/18/13 [facility] Pager [phone]. If 8PM-8AM, please contact night-coverage at [email or web address], password [facility] 06/20/2013, 1:41 PM  LOS: 4 days

## 2013-06-21 DIAGNOSIS — F03918 Unspecified dementia, unspecified severity, with other behavioral disturbance: Secondary | ICD-10-CM

## 2013-06-21 DIAGNOSIS — F0391 Unspecified dementia with behavioral disturbance: Secondary | ICD-10-CM

## 2013-06-21 MED ORDER — ACETAMINOPHEN 325 MG PO TABS: 650.0000 mg | ORAL_TABLET | ORAL | Status: AC | PRN

## 2013-06-21 MED ORDER — SENNOSIDES-DOCUSATE SODIUM 8.6-50 MG PO TABS: 2.0000 | ORAL_TABLET | Freq: Every evening | ORAL | Status: AC | PRN

## 2013-06-21 NOTE — Progress Notes (Signed)
Discharge instructions and prescriptions given to pt. Family member.  Family member verbalized understanding of all orders/instructions.  IV removed.  All questions answered.  Pt. Discharged to home with son. Syliva Overman

## 2013-06-21 NOTE — Discharge Summary (Signed)
Physician Discharge Summary  Wendy Wade DGL:[id number] DOB: Dec 07, 1923 DOA: 06/16/2013  PCP: No PCP Per Patient  Admit date: 06/16/2013 Discharge date: 06/21/2013  Time spent: 50* minutes  Recommendations for Outpatient Follow-up:  1. Follow up PCP in 2 weeks  Discharge Diagnoses:  Active Problems:   Altered mental status   Dementia with behavioral disturbance   Anemia   Elevated troponin   NSTEMI (non-ST elevated myocardial infarction)   Abdominal mass   Weakness generalized   Palliative care encounter   Discharge Condition: *Stable  Diet recommendation: *Regular diet  Filed Weights   06/16/13 2109 06/17/13 0323 06/20/13 1424  Weight: 42.1 kg (92 lb 13 oz) 42.2 kg (93 lb 0.6 oz) 43.5 kg (95 lb 14.4 oz)    History of present illness:  80 y.o. female has a past medical history of Shortness of breath and Hypertension patient refused to father evaluation at [facility] and was transferred to [facility]. I spoke to Dr. Nadyne Coombes who at this point recommends aspirin 81 mg a day and GI workup for possible chronic GI blood loss. Family states that she has not had any blood per rectum, but has had black stools for past 2 weeks, mass around her umbilicus it has been there for past 4-5 months. Patient has had extensive weight loss. Patient found to have metastatic colon cancer with mets to abdomen and liver   Hospital Course:  NSTEMI (non-ST elevated myocardial infarction)  Patient had elevated troponin and was seen by cardiology - All the cardiac medications have been discontinued after Dr Einar Gip ahd the discussion with family regarding the poor prognosis.  - Goal is comfort.   Anemia Acute GI bleed:  - Hemoglobin has been stable,  GI has signed off.   Metastatic colon cancer involving the abdomen and liver  Discussed with family, no aggressive interventions recommended by GI.Palliative care now involved, final plan is still pending.[facility], Dr Einar Gip knows  the patient and family for longtime, and after long discussion residential hospice ws recommended. Will request social work consult for the hospice and let palliative care work out the details.Discussed with palliative care team, family wants to take her home with hospice.Will discharge home with hospice.  Altered mental status/Dementia with behavioral disturbance  - stable.    Procedures:  None  Consultations:  Cardiology  Palliative care  Discharge Exam: Filed Vitals:   06/21/13 0627  BP: 122/65  Pulse: 78  Temp: 97.7 F (36.5 C)  Resp: 18    General: Appear in no acute dsitress Cardiovascular: S1s2 RRR Respiratory: *Clear bilaterally  Discharge Instructions  Discharge Orders   Future Orders Complete By Expires   Diet - low sodium heart healthy  As directed    Increase activity slowly  As directed        Medication List         acetaminophen 325 MG tablet  Commonly known as:  TYLENOL  Take 2 tablets (650 mg total) by mouth every 4 (four) hours as needed for headache or mild pain.     atorvastatin 10 MG tablet  Commonly known as:  LIPITOR  Take by mouth daily.     senna-docusate 8.6-50 MG per tablet  Commonly known as:  Senokot-S  Take 2 tablets by mouth at bedtime as needed for mild constipation.       No Known Allergies    The results of significant diagnostics from this hospitalization (including imaging, microbiology, ancillary and laboratory) are listed below for  reference.    Significant Diagnostic Studies: Ct Abdomen Pelvis W Contrast  06/17/2013   CLINICAL DATA:  Palpable paraumbilical mass  EXAM: CT ABDOMEN AND PELVIS WITH CONTRAST  TECHNIQUE: Multidetector CT imaging of the abdomen and pelvis was performed using the standard protocol following bolus administration of intravenous contrast.  CONTRAST:  153mL OMNIPAQUE IOHEXOL 300 MG/ML  SOLN  COMPARISON:  None.  FINDINGS: Lower Chest: Patient and respiratory motion results in significant motion  artifact in the lung bases. Calcified pulmonary nodules noted in the posterior left lung, likely old granulomas are sequelae of chronic small volume aspiration. There are small bilateral pleural effusions and associated trace dependent atelectasis. Visualized cardiac structures are within normal limits for size. Atherosclerotic calcifications noted throughout the coronary arteries. Fibro fatty remodeling noted in the knee left ventricular endocardium suggesting prior myocardial infarction. Unremarkable visualized thoracic esophagus.  Abdomen/Pelvis: Unremarkable appearance of the stomach, duodenum, adrenal glands and pancreas. Multiple calcified granulomas noted throughout the spleen. Multiple large peripherally enhancing centrally hypo attenuating and likely necrotic hepatic masses concerning for metastatic disease. The largest mass measures up to 8 point by 8.3 cm in the inferior right hepatic lobe. The largest mass in the left hepatic lobe measures approximately 5.5 x 5.1 cm. In addition to the peripherally enhancing centrally necrotic hepatic masses there are several circumscribed low-attenuation hepatic lesions which are more consistent with simple cysts. The liver contour is unremarkable and does not appear cirrhotic. Portal veins remain patent without evidence of macro vascular invasion.  No hydronephrosis or nephrolithiasis. No enhancing renal mass. 2.2 cm simple cyst exophytic from the upper pole of well left kidney. Additional circumscribed low-attenuation lesions in both kidneys are too small to characterize but statistically highly likely benign cysts.  Irregular circumferential masslike wall thickening of the ascending colon just proximal to the hepatic flexure. The mass measures approximately 3.6 cm in length and significantly narrows of the colonic lumen. Proximal to this, the cecum and ascending colon demonstrate extensive submucosal edema and and almost cobblestone appearance. Haziness and stranding  in the pericolonic fat concerning for tumor infiltration. There is an 11 mm pericolonic lymph node versus metastatic focus in the right pericolic gutter on image 43. Large enhancing soft tissue mass in the right medial inguinal recess measures 4.6 by 3.0 cm. The soft tissue mass abuts and is inseparable to the adjacent redundant sigmoid colon. It may be arising from the sigmoid colon or reflect a peritoneal implant. Colonic diverticular disease without CT evidence of active inflammation. Highly redundant sigmoid colon. There is a 12 mm enhancing peritoneal implant versus periumbilical node node just deep to the umbilicus. Small the moderate volume of perihepatic, perisplenic and pelvic ascites is highly likely malignant. Additional scattered mesenteric / peritoneal implants versus mesenteric adenopathy are noted.  Surgical changes of prior hysterectomy. Unremarkable appearance of the bladder.  Bones/Soft Tissues: Extensive multilevel degenerative disc disease and spondylosis throughout the spine. No definite lytic osseous lesion. There is focal sclerosis lateral aspect of the left seventh rib of which could represent a remote healed fracture or potentially a blastic lesion.  Vascular: Extensive atherosclerotic vascular disease without aneurysmal dilatation.  IMPRESSION: 1. CT findings are most consistent primary right ascending colonic adenocarcinoma with widespread metastatic disease including peritoneal carcinomatosis, presumably malignant ascites and extensive hepatic metastatic disease. Of note, the primary mass significantly narrows but does not obstruct the colonic lumen. The palpable periumbilical mass represent either a sister Wynona Dove metastatic lymph node, or peritoneal implant. 2. Extensive submucosal edema involving  the cecum and ascending colon proximal to the mass without significant pericolonic inflammatory stranding. This is favored to reflect lymphatic congestion. A superimposed infectious or  inflammatory colitis is a less likely consideration. 3. Extensive atherosclerotic vascular disease including coronary artery disease with evidence of remote myocardial infarction. 4. Additional ancillary findings as above.   Electronically Signed   By: Jacqulynn Cadet M.D.   On: 06/17/2013 17:00    Microbiology: Recent Results (from the past 240 hour(s))  MRSA PCR SCREENING     Status: None   Collection Time    06/17/13  3:07 AM      Result Value Range Status   MRSA by PCR NEGATIVE  NEGATIVE Final   Comment:            The GeneXpert MRSA Assay (FDA     approved for NASAL specimens     only), is one component of a     comprehensive MRSA colonization     surveillance program. It is not     intended to diagnose MRSA     infection nor to guide or     monitor treatment for     MRSA infections.  CLOSTRIDIUM DIFFICILE BY PCR     Status: None   Collection Time    06/17/13 11:12 AM      Result Value Range Status   C difficile by pcr NEGATIVE  NEGATIVE Final     Labs: Basic Metabolic Panel:  Recent Labs Lab 06/17/13 0101 06/17/13 1300  NA 142 137  K 4.3 5.0  CL 106 104  CO2 22 20  GLUCOSE 72 61*  BUN 12 12  CREATININE 0.83 0.83  CALCIUM 8.1* 8.0*   Liver Function Tests:  Recent Labs Lab 06/17/13 0101  AST 53*  ALT 12  ALKPHOS 108  BILITOT 0.4  PROT 5.5*  ALBUMIN 2.6*   No results found for this basename: LIPASE, AMYLASE,  in the last 168 hours No results found for this basename: AMMONIA,  in the last 168 hours CBC:  Recent Labs Lab 06/17/13 0101 06/17/13 1300 06/17/13 1841  WBC 5.9 5.7 6.3  NEUTROABS 3.7  --   --   HGB 7.7* 12.5 12.3  HCT 27.6* 38.7 38.4  MCV 72.6* 77.9* 77.4*  PLT 451* 297 342   Cardiac Enzymes:  Recent Labs Lab 06/17/13 0101 06/17/13 1300 06/17/13 1841 06/17/13 2356  TROPONINI 6.42* 2.39*  2.85* 2.37* 2.42*   BNP: BNP (last 3 results) No results found for this basename: PROBNP,  in the last 8760 hours CBG:  Recent  Labs Lab 06/17/13 2208 06/18/13 [place]       Signed:  Burns Timson S  [facility] 06/21/2013, 9:59 AM

## 2013-06-21 NOTE — Progress Notes (Signed)
Brought in my 3 children as she is very close to me for a visit. I will continue to be supportive to her with hospice needs.

## 2013-06-26 NOTE — Consult Note (Signed)
I have reviewed and discussed the care of this patient in detail with the nurse practitioner including pertinent patient records, physical exam findings and data. I agree with details of this encounter.  

## 2013-08-20 DEATH — deceased

## 2014-09-12 NOTE — Consult Note (Signed)
PATIENT NAME:  Wendy Wade, Wendy Wade MR#:  [id number] DATE OF BIRTH:  Jun 26, 1923  DATE OF ADMISSION:  06/16/2013 DATE OF CONSULTATION:  06/16/2013  REFERRING PHYSICIAN:  Lenise Arena, MD  CONSULTING PHYSICIAN:  Kania Regnier B. Caitlyne Ingham, MD  REASON FOR CONSULTATION: To evaluate the capacity to make medical decisions.   IDENTIFYING DATA: Wendy Wade is an 81 year old female with no past psychiatric history.    CHIEF COMPLAINT: "I want to see Dr. Einar Gip."   HISTORY OF PRESENT ILLNESS: Wendy Wade has been avoiding seeing doctors for the past year or so. Her favorite doctor, Dr. Einar Gip, a cardiologist in [place], has not seen her for the same length of time. In the past few months, the patient has fallen several times.  One time, she injured her shoulder; another time, her wrist. She continues to refuse seeing a doctor and refuses taking any medications. She has been weak recently, and her son, who has been living with her for the past 7 months, started worrying. He brought her to the hospital with no particular complaints. During initial investigation by Emergency Room physician, it was discovered that the patient has several treatable medical conditions. The patient, however, refused any treatment and demanded to be discharged to home. Psychiatry was asked to assess the patient's capacity to make medical decisions. The patient was examined in the Emergency Room. She was in bed, asking repeatedly to go to the bathroom. She still has not  given her urine for analysis. She explained to me that she would not be able to urinate in her Emergency Room where there is commode. She asked to be taken to regular bathroom. She was able to get to the bathroom and very promptly was able to produce a urine sample. She also has been complaining of constipation. This has been an ongoing problem. The patient told me that she used to like her doctor in [place], but this doctor has retired, and upon retirement advised the patient  to take good care of herself and eat healthy. She then developed a relationship with Dr. Einar Gip in [place], and today decided that may be this is a way to compromise instead of getting help in our Emergency Room, she requested to be transferred to [facility] [address] where she could renew her relationship with Dr. Einar Gip. Her son, who is supportive, but a little uncertain what to do, said that this was an excellent solution to our dilemma. Dr. Jimmye Norman, who provided care in the Emergency Room, also agreed. The patient possibly has some history of dementia, but I could not detect much in terms of cognitive decline. Her son feels that his mother is as sharp as she has ever been. He understands her reluctance to be treated and wants to respect it. He was quite happy that a compromise could be found.   PAST PSYCHIATRIC HISTORY: The patient has never suffered any mental illness and has never been treated for it. There is no history of substance abuse.   FAMILY PSYCHIATRIC HISTORY:  None.  PAST MEDICAL HISTORY: Constipation, falls, coronary artery disease, hypothyroidism, rule out cardiac event, anemia.   ALLERGIES: No known drug allergies.   MEDICATIONS ON ADMISSION: None.   SOCIAL HISTORY: She is widowed. She used to live independently. Her son moved in with her 7 months ago. He does most of the housework, shopping and cooking.   REVIEW OF SYSTEMS: Unable to obtain, but the patient denies any pain. She does complain of constipation.   PHYSICAL EXAMINATION: VITAL SIGNS: Blood pressure  94/68, pulse 79, respirations 18, temperature 98.7.  GENERAL: This is a slender, elderly female in no acute distress. The rest of the physical examination is deferred to her primary attending.   LABORATORY DATA: Chemistries are within normal limits. LFTs within normal limits except for AST of 59. CARDIAC ENZYMES: CK-MB 12.5.  TSH 47.3, free thyroxine 0.32. CBC: White blood count 4.5, hemoglobin 7.5, hematocrit 25,  platelets 372. INR 1. Urinalysis is not suggestive of urinary tract infection.   MENTAL STATUS EXAMINATION: The patient is alert and oriented to person, place and situation. She is pleasant, polite and cooperative. She is agequately groomed wearing hospital scrubs. She is rather weak, speaks with a very low voice. She maintains good eye contact. Her mood is fine. She is not suicidal or homicidal. There is no psychosis. Her cognition seems intact but the patient does not cooperate in the cognitive part of the examination. She is intelligent with average fund of knowledge. Her insight and judgment may feel questionable.   DIAGNOSES: AXIS I: Adjustment disorder with depressed mood. Cognitive disorder, not otherwise specified.  AXIS II: Deferred.  AXIS III: Coronary artery disease, rule out cardiac event. Hypothyroidism, constipation.  AXIS IV:  Cognitive and physical decline, loss of way of life.  AXIS V:  Global assessment of functioning is 50.   PLAN: The patient does not have the capacity to make decisions about her treatment. We all agreed that transfer to [facility] for further treatment with trusted physician is the best way to   address the patient's reluctance to get medical treatment. Her son is in agreement. Psychiatry will sign off.    ____________________________ Wardell Honour. Bary Leriche, MD jbp:dmm D: 06/16/2013 20:29:00 ET T: 06/16/2013 20:53:12 ET JOB#: [id number]  cc: Lainie Daubert B. Bary Leriche, MD, <Dictator> Clovis Fredrickson MD ELECTRONICALLY SIGNED 07/13/2013 6:45
